# Patient Record
Sex: Male | Born: 1985 | Race: Black or African American | Hispanic: No | Marital: Single | Smoking: Former smoker
Health system: Southern US, Community
[De-identification: ages and names within clinical notes are randomized; demographics above are authoritative.]

## PROBLEM LIST (undated history)

## (undated) DIAGNOSIS — E119 Type 2 diabetes mellitus without complications: Secondary | ICD-10-CM

## (undated) DIAGNOSIS — J40 Bronchitis, not specified as acute or chronic: Secondary | ICD-10-CM

## (undated) DIAGNOSIS — E7800 Pure hypercholesterolemia, unspecified: Secondary | ICD-10-CM

---
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
## 2003-03-17 ENCOUNTER — Emergency Department (HOSPITAL_COMMUNITY): Admission: EM | Admit: 2003-03-17 | Discharge: 2003-03-18 | Payer: Self-pay | Admitting: Emergency Medicine

## 2003-03-20 ENCOUNTER — Emergency Department (HOSPITAL_COMMUNITY): Admission: EM | Admit: 2003-03-20 | Discharge: 2003-03-20 | Payer: Self-pay | Admitting: Emergency Medicine

## 2003-04-21 ENCOUNTER — Emergency Department (HOSPITAL_COMMUNITY): Admission: EM | Admit: 2003-04-21 | Discharge: 2003-04-21 | Payer: Self-pay | Admitting: Emergency Medicine

## 2003-04-21 ENCOUNTER — Encounter: Payer: Self-pay | Admitting: Emergency Medicine

## 2003-05-10 ENCOUNTER — Emergency Department (HOSPITAL_COMMUNITY): Admission: EM | Admit: 2003-05-10 | Discharge: 2003-05-10 | Payer: Self-pay | Admitting: Emergency Medicine

## 2005-06-11 ENCOUNTER — Emergency Department (HOSPITAL_COMMUNITY): Admission: EM | Admit: 2005-06-11 | Discharge: 2005-06-11 | Payer: Self-pay | Admitting: Emergency Medicine

## 2005-11-14 ENCOUNTER — Emergency Department (HOSPITAL_COMMUNITY): Admission: EM | Admit: 2005-11-14 | Discharge: 2005-11-14 | Payer: Self-pay | Admitting: Emergency Medicine

## 2006-02-19 ENCOUNTER — Emergency Department (HOSPITAL_COMMUNITY): Admission: EM | Admit: 2006-02-19 | Discharge: 2006-02-19 | Payer: Self-pay | Admitting: Emergency Medicine

## 2010-11-03 ENCOUNTER — Emergency Department (HOSPITAL_COMMUNITY)
Admission: EM | Admit: 2010-11-03 | Discharge: 2010-11-03 | Disposition: A | Discharge status: Home / Self Care | Payer: Self-pay | Attending: Emergency Medicine | Admitting: Emergency Medicine

## 2010-11-03 DIAGNOSIS — R599 Enlarged lymph nodes, unspecified: Secondary | ICD-10-CM | POA: Insufficient documentation

## 2010-11-03 DIAGNOSIS — M542 Cervicalgia: Secondary | ICD-10-CM | POA: Insufficient documentation

## 2010-11-03 DIAGNOSIS — F17200 Nicotine dependence, unspecified, uncomplicated: Secondary | ICD-10-CM | POA: Insufficient documentation

## 2010-11-03 DIAGNOSIS — R5381 Other malaise: Secondary | ICD-10-CM | POA: Insufficient documentation

## 2010-11-03 DIAGNOSIS — J3489 Other specified disorders of nose and nasal sinuses: Secondary | ICD-10-CM | POA: Insufficient documentation

## 2010-11-03 DIAGNOSIS — J029 Acute pharyngitis, unspecified: Secondary | ICD-10-CM | POA: Insufficient documentation

## 2010-11-03 DIAGNOSIS — R5383 Other fatigue: Secondary | ICD-10-CM | POA: Insufficient documentation

## 2010-11-03 DIAGNOSIS — H9209 Otalgia, unspecified ear: Secondary | ICD-10-CM | POA: Insufficient documentation

## 2010-11-03 DIAGNOSIS — R071 Chest pain on breathing: Secondary | ICD-10-CM | POA: Insufficient documentation

## 2010-11-03 LAB — RAPID STREP SCREEN (MED CTR MEBANE ONLY): Streptococcus, Group A Screen (Direct): NEGATIVE

## 2011-06-24 ENCOUNTER — Emergency Department (HOSPITAL_COMMUNITY)
Admission: EM | Admit: 2011-06-24 | Discharge: 2011-06-24 | Disposition: A | Discharge status: Home / Self Care | Payer: Self-pay | Attending: Emergency Medicine | Admitting: Emergency Medicine

## 2011-06-24 DIAGNOSIS — F17200 Nicotine dependence, unspecified, uncomplicated: Secondary | ICD-10-CM | POA: Insufficient documentation

## 2011-06-24 DIAGNOSIS — X58XXXA Exposure to other specified factors, initial encounter: Secondary | ICD-10-CM | POA: Insufficient documentation

## 2011-06-24 DIAGNOSIS — S335XXA Sprain of ligaments of lumbar spine, initial encounter: Secondary | ICD-10-CM | POA: Insufficient documentation

## 2011-06-24 DIAGNOSIS — E669 Obesity, unspecified: Secondary | ICD-10-CM | POA: Insufficient documentation

## 2011-06-24 DIAGNOSIS — M549 Dorsalgia, unspecified: Secondary | ICD-10-CM | POA: Insufficient documentation

## 2011-06-24 LAB — URINALYSIS, ROUTINE W REFLEX MICROSCOPIC
Bilirubin Urine: NEGATIVE
Glucose, UA: NEGATIVE mg/dL
Hgb urine dipstick: NEGATIVE
Ketones, ur: NEGATIVE mg/dL
Leukocytes, UA: NEGATIVE
Nitrite: NEGATIVE
Protein, ur: NEGATIVE mg/dL
Specific Gravity, Urine: 1.018 (ref 1.005–1.030)
Urobilinogen, UA: 0.2 mg/dL (ref 0.0–1.0)
pH: 6 (ref 5.0–8.0)

## 2011-09-22 ENCOUNTER — Emergency Department (INDEPENDENT_AMBULATORY_CARE_PROVIDER_SITE_OTHER)
Admission: EM | Admit: 2011-09-22 | Discharge: 2011-09-22 | Disposition: A | Discharge status: Home / Self Care | Payer: Self-pay | Source: Home / Self Care | Attending: Emergency Medicine | Admitting: Emergency Medicine

## 2011-09-22 DIAGNOSIS — M109 Gout, unspecified: Secondary | ICD-10-CM

## 2011-09-22 HISTORY — DX: Bronchitis, not specified as acute or chronic: J40

## 2011-09-22 LAB — URIC ACID: Uric Acid, Serum: 10.8 mg/dL — ABNORMAL HIGH (ref 4.0–7.8)

## 2011-09-22 MED ORDER — INDOMETHACIN 50 MG PO CAPS: 50.0000 mg | ORAL_CAPSULE | Freq: Three times a day (TID) | ORAL | Status: DC

## 2011-09-22 MED ORDER — KETOROLAC TROMETHAMINE 60 MG/2ML IM SOLN
INTRAMUSCULAR | Status: AC
  Filled 2011-09-22: qty 2

## 2011-09-22 MED ORDER — KETOROLAC TROMETHAMINE 60 MG/2ML IM SOLN
60.0000 mg | Freq: Once | INTRAMUSCULAR | Status: AC
  Administered 2011-09-22: 60 mg via INTRAMUSCULAR

## 2011-09-22 NOTE — ED Notes (Signed)
Pt c/o lt great toe redness, pain and swelling that started last night while he was bowling.  Denies known injury.  States redness and pain is extending back into his foot and along base of toes.

## 2011-09-22 NOTE — ED Provider Notes (Signed)
Chief Complaint  Patient presents with  . Foot Pain    History of Present Illness:  The patient has had a two-day history of swelling, pain, redness, and heat of the MTP joint of the left right toe. He denies any trauma to the area. He's had no fever or chills. No other joint pains. He's had no similar episodes in the past and has not had a history of gout. His father has gout. He has recently eaten sea food and drank alcohol.  Review of Systems:  Other than noted above, the patient denies any of the following symptoms: Systemic:  No fevers, chills, sweats, or aches.  No fatigue or tiredness. Musculoskeletal:  No joint pain, arthritis, bursitis, swelling, back pain, or neck pain. Neurological:  No muscular weakness, paresthesias, headache, or trouble with speech or coordination.  No dizziness.   PMFSH:  Past medical history, family history, social history, meds, and allergies were reviewed.  Physical Exam:   Vital signs:  BP 144/68  Pulse 105  Temp(Src) 98.2 F (36.8 C) (Oral)  Resp 16  SpO2 100% Gen:  Alert and oriented times 3.  In no distress. Musculoskeletal: Exam of the foot reveals marked swelling, erythema, heat, and tenderness to palpation centered over the MTP joint of the left great toe. This extends up onto the dorsum of the foot and down the toe. Otherwise, all joints had a full a ROM with no swelling, bruising or deformity.  No edema, pulses full. Extremities were warm and pink.  Capillary refill was brisk.  Skin:  Clear, warm and dry.  No rash. Neuro:  Alert and oriented times 3.  Muscle strength was normal.  Sensation was intact to light touch.   Labs:   Results for orders placed during the hospital encounter of 09/22/11  URIC ACID      Component Value Range   Uric Acid, Serum 10.8 (*) 4.0 - 7.8 (mg/dL)     Radiology:  No results found.  Medications given in [facility]:  He was given Toradol 60 mg IM.  Assessment:   Diagnoses that have been ruled out:  Diagnoses that  are still under consideration:  Final diagnoses:  Gout    Plan:   1.  The following meds were prescribed:   New Prescriptions   INDOMETHACIN (INDOCIN) 50 MG CAPSULE    Take 1 capsule (50 mg total) by mouth 3 (three) times daily with meals.   2.  The patient was instructed in symptomatic care, including rest and activity, elevation, application of ice and compression.  Appropriate handouts were given. 3.  The patient was told to return if becoming worse in any way, if no better in 3 or 4 days, and given some red flag symptoms that would indicate earlier return.   4.  The patient was told to follow up in one to 2 weeks with a primary care physician.   Roque Lias, MD 09/22/11 [phone]

## 2011-09-25 ENCOUNTER — Telehealth (HOSPITAL_COMMUNITY): Payer: Self-pay | Admitting: *Deleted

## 2011-09-25 NOTE — ED Notes (Signed)
Order obtained from Dr. Lorenz Coaster for Tramadol 50 mg. #20 1-2 every 8 hrs prn pain.  Pt. wants Rx. called to [facility] on [address].  Rx. called to [facility] @ [phone]. Jimmy Jordan 09/25/2011

## 2011-10-15 ENCOUNTER — Telehealth (HOSPITAL_COMMUNITY): Payer: Self-pay | Admitting: *Deleted

## 2011-10-15 NOTE — ED Notes (Signed)
Discussed with Dr. Juanetta Gosling about pt. needing refill of Indocin for gout. Pt. has a pending appt. in 2 weeks with PCP.  He approved refill. I called pt.'s girlfriend back and told her Dr. approved a refill and she said to call it to [facility] on [address]. Rx. called to pharamcist @ [phone].

## 2011-11-04 ENCOUNTER — Emergency Department (HOSPITAL_COMMUNITY)
Admission: EM | Admit: 2011-11-04 | Discharge: 2011-11-04 | Disposition: A | Discharge status: Home Health | Payer: Self-pay | Attending: Emergency Medicine | Admitting: Emergency Medicine

## 2011-11-04 ENCOUNTER — Encounter (HOSPITAL_COMMUNITY): Payer: Self-pay | Admitting: Adult Health

## 2011-11-04 DIAGNOSIS — M79609 Pain in unspecified limb: Secondary | ICD-10-CM | POA: Insufficient documentation

## 2011-11-04 DIAGNOSIS — M109 Gout, unspecified: Secondary | ICD-10-CM | POA: Insufficient documentation

## 2011-11-04 MED ORDER — TRAMADOL HCL 50 MG PO TABS: 50.0000 mg | ORAL_TABLET | Freq: Four times a day (QID) | ORAL | Status: AC | PRN

## 2011-11-04 MED ORDER — INDOMETHACIN 25 MG PO CAPS: 25.0000 mg | ORAL_CAPSULE | Freq: Three times a day (TID) | ORAL | Status: AC | PRN

## 2011-11-04 NOTE — ED Notes (Signed)
Reports 2 months of left great toe pain that feels like gout. Toe is painful to touch. No redness noted

## 2011-11-04 NOTE — ED Provider Notes (Signed)
History     CSN: [id number]  Arrival date & time 11/04/11  1632   First MD Initiated Contact with Patient 11/04/11 1803      Chief Complaint  Patient presents with  . Toe Pain    (Consider location/radiation/quality/duration/timing/severity/associated sxs/prior treatment) HPI  26 year old male with history of gout, is presenting to the ED with chief complaints of left great toe pain. Patient states he has been experiencing pain to his left great toe for the past 2 months. Pain is described as a throbbing, pressure sensation worsening with palpation and walking. He has been seen and evaluated at the urgent care clinic 2 months ago and has been taking indomethacin and ultram that were prescribed.  Pt has ran out of his meds for the past few days.  Sts pain worsen without medication.  His dad has gout and recommend him to come to ER to have his toe "drained".  Pt also sts he has been avoiding red meat and shellfish, however does admits to drinking alcohol on a regular basis.  Pt denies fever, chills, rash, ankle or knee pain.  Denies any recent trauma.    Past Medical History  Diagnosis Date  . Bronchitis     No past surgical history on file.  No family history on file.  History  Substance Use Topics  . Smoking status: Current Everyday Smoker -- 0.5 packs/day    Types: Cigarettes  . Smokeless tobacco: Not on file  . Alcohol Use: Yes     occasional      Review of Systems  All other systems reviewed and are negative.    Allergies  Penicillins  Home Medications   Current Outpatient Rx  Name Route Sig Dispense Refill  . INDOMETHACIN 50 MG PO CAPS Oral Take 1 capsule (50 mg total) by mouth 3 (three) times daily with meals. 30 capsule 0    There were no vitals taken for this visit.  Physical Exam  Constitutional: He appears well-developed and well-nourished. No distress.  HENT:  Head: Atraumatic.  Eyes: Conjunctivae are normal.  Neck: Neck supple.    Musculoskeletal: Normal range of motion.       Tenderness noted to left great toe without obvious overlying skin changes, swelling, or rash. Normal range of motion of great toe.  Normal left ankle examination. No evidence of difficulty. Sensation is intact throughout. Brisk capillary refills to all toes.  Neurological: He is alert.  Skin: Skin is warm.    ED Course  Procedures (including critical care time)  Labs Reviewed - No data to display No results found.   No diagnosis found.    MDM  Left great toe pain, likely secondary to gout. Patient does have a family history of gout, and history of alcohol on a regular basis, which may contribute to his gouty flare. I recommend alcohol cessation and avoidance of certain types of food. I will also prescribe Ultram and indomethacin. Hold give her referral to a foot specialist in the primary care Dr. for further management. I do not believe that this is a septic joint, secondary to infection.        Fayrene Helper, PA-C 11/04/11 1820

## 2011-11-04 NOTE — Discharge Instructions (Signed)
Gout Gout is an inflammatory condition (arthritis) caused by a buildup of uric acid crystals in the joints. Uric acid is a chemical that is normally present in the blood. Under some circumstances, uric acid can form into crystals in your joints. This causes joint redness, soreness, and swelling (inflammation). Repeat attacks are common. Over time, uric acid crystals can form into masses (tophi) near a joint, causing disfigurement. Gout is treatable and often preventable. CAUSES  The disease begins with elevated levels of uric acid in the blood. Uric acid is produced by your body when it breaks down a naturally found substance called purines. This also happens when you eat certain foods such as meats and fish. Causes of an elevated uric acid level include:  Being passed down from parent to child (heredity).   Diseases that cause increased uric acid production (obesity, psoriasis, some cancers).   Excessive alcohol use.   Diet, especially diets rich in meat and seafood.   Medicines, including certain cancer-fighting drugs (chemotherapy), diuretics, and aspirin.   Chronic kidney disease. The kidneys are no longer able to remove uric acid well.   Problems with metabolism.  Conditions strongly associated with gout include:  Obesity.   High blood pressure.   High cholesterol.   Diabetes.  Not everyone with elevated uric acid levels gets gout. It is not understood why some people get gout and others do not. Surgery, joint injury, and eating too much of certain foods are some of the factors that can lead to gout. SYMPTOMS   An attack of gout comes on quickly. It causes intense pain with redness, swelling, and warmth in a joint.   Fever can occur.   Often, only one joint is involved. Certain joints are more commonly involved:   Base of the big toe.   Knee.   Ankle.   Wrist.   Finger.  Without treatment, an attack usually goes away in a few days to weeks. Between attacks, you  usually will not have symptoms, which is different from many other forms of arthritis. DIAGNOSIS  Your caregiver will suspect gout based on your symptoms and exam. Removal of fluid from the joint (arthrocentesis) is done to check for uric acid crystals. Your caregiver will give you a medicine that numbs the area (local anesthetic) and use a needle to remove joint fluid for exam. Gout is confirmed when uric acid crystals are seen in joint fluid, using a special microscope. Sometimes, blood, urine, and X-ray tests are also used. TREATMENT  There are 2 phases to gout treatment: treating the sudden onset (acute) attack and preventing attacks (prophylaxis). Treatment of an Acute Attack  Medicines are used. These include anti-inflammatory medicines or steroid medicines.   An injection of steroid medicine into the affected joint is sometimes necessary.   The painful joint is rested. Movement can worsen the arthritis.   You may use warm or cold treatments on painful joints, depending which works best for you.   Discuss the use of coffee, vitamin C, or cherries with your caregiver. These may be helpful treatment options.  Treatment to Prevent Attacks After the acute attack subsides, your caregiver may advise prophylactic medicine. These medicines either help your kidneys eliminate uric acid from your body or decrease your uric acid production. You may need to stay on these medicines for a very long time. The early phase of treatment with prophylactic medicine can be associated with an increase in acute gout attacks. For this reason, during the first few months   of treatment, your caregiver may also advise you to take medicines usually used for acute gout treatment. Be sure you understand your caregiver's directions. You should also discuss dietary treatment with your caregiver. Certain foods such as meats and fish can increase uric acid levels. Other foods such as dairy can decrease levels. Your caregiver  can give you a list of foods to avoid. HOME CARE INSTRUCTIONS   Do not take aspirin to relieve pain. This raises uric acid levels.   Only take over-the-counter or prescription medicines for pain, discomfort, or fever as directed by your caregiver.   Rest the joint as much as possible. When in bed, keep sheets and blankets off painful areas.   Keep the affected joint raised (elevated).   Use crutches if the painful joint is in your leg.   Drink enough water and fluids to keep your urine clear or pale yellow. This helps your body get rid of uric acid. Do not drink alcoholic beverages. They slow the passage of uric acid.   Follow your caregiver's dietary instructions. Pay careful attention to the amount of protein you eat. Your daily diet should emphasize fruits, vegetables, whole grains, and fat-free or low-fat milk products.   Maintain a healthy body weight.  SEEK MEDICAL CARE IF:   You have an oral temperature above 102 F (38.9 C).   You develop diarrhea, vomiting, or any side effects from medicines.   You do not feel better in 24 hours, or you are getting worse.  SEEK IMMEDIATE MEDICAL CARE IF:   Your joint becomes suddenly more tender and you have:   Chills.   An oral temperature above 102 F (38.9 C), not controlled by medicine.  MAKE SURE YOU:   Understand these instructions.   Will watch your condition.   Will get help right away if you are not doing well or get worse.  Document Released: 08/29/2000 Document Revised: 05/14/2011 Document Reviewed: 12/10/2009 [employer] Patient Information 2012 [employer]. 

## 2011-11-08 NOTE — ED Provider Notes (Signed)
Medical screening examination/treatment/procedure(s) were performed by non-physician practitioner and as supervising physician I was immediately available for consultation/collaboration.   Yoselin Amerman A. Lettie Czarnecki, MD 11/08/11 0710 

## 2013-12-04 ENCOUNTER — Encounter (HOSPITAL_COMMUNITY): Payer: Self-pay | Admitting: Emergency Medicine

## 2013-12-04 ENCOUNTER — Emergency Department (HOSPITAL_COMMUNITY): Payer: Self-pay

## 2013-12-04 ENCOUNTER — Emergency Department (HOSPITAL_COMMUNITY)
Admission: EM | Admit: 2013-12-04 | Discharge: 2013-12-04 | Disposition: A | Discharge status: Home / Self Care | Payer: Self-pay | Attending: Emergency Medicine | Admitting: Emergency Medicine

## 2013-12-04 DIAGNOSIS — F17200 Nicotine dependence, unspecified, uncomplicated: Secondary | ICD-10-CM | POA: Insufficient documentation

## 2013-12-04 DIAGNOSIS — Z79899 Other long term (current) drug therapy: Secondary | ICD-10-CM | POA: Insufficient documentation

## 2013-12-04 DIAGNOSIS — Z880 Allergy status to penicillin: Secondary | ICD-10-CM | POA: Insufficient documentation

## 2013-12-04 DIAGNOSIS — M25476 Effusion, unspecified foot: Secondary | ICD-10-CM | POA: Insufficient documentation

## 2013-12-04 DIAGNOSIS — M25473 Effusion, unspecified ankle: Secondary | ICD-10-CM | POA: Insufficient documentation

## 2013-12-04 DIAGNOSIS — M109 Gout, unspecified: Secondary | ICD-10-CM | POA: Insufficient documentation

## 2013-12-04 MED ORDER — TRAMADOL HCL 50 MG PO TABS: 50.0000 mg | ORAL_TABLET | Freq: Four times a day (QID) | ORAL | Status: DC | PRN

## 2013-12-04 MED ORDER — INDOMETHACIN 25 MG PO CAPS: 25.0000 mg | ORAL_CAPSULE | Freq: Three times a day (TID) | ORAL | Status: DC | PRN

## 2013-12-04 NOTE — ED Notes (Signed)
Pt reports pain and stiffness in 1 st toe r/foot. Pt reports sharp pain in joint with movement x 1 day. Hx of gout

## 2013-12-04 NOTE — Discharge Instructions (Signed)
Please call and set-up an appointment with [facility] to be assessed  Please call and set-up an appointment with [facility] Please rest and stay hydrated Please take medications as prescribed - while on pain medications is to be no drinking alcohol, driving, operating any heavy machinery if there is extra please dispose in a proper manner. Please keep foot elevated and rest Please continue to monitor symptoms closely and if symptoms are to worsen or change (fever greater than 101, chills, chest pain, shortness of breath, difficulty breathing, numbness, tingling, red streaks running up the foot, worsening swelling, drainage, fall, injury) please report back to the ED immediately   Gout Gout is when your joints become red, sore, and swell (inflammed). This is caused by the buildup of uric acid crystals in the joints. Uric acid is a chemical that is normally in the blood. If the level of uric acid gets too high in the blood, these crystals form in your joints and tissues. Over time, these crystals can form into masses near the joints and tissues. These masses can destroy bone and cause the bone to look misshapen (deformed). HOME CARE   Do not take aspirin for pain.  Only take medicine as told by your doctor.  Rest the joint as much as you can. When in bed, keep sheets and blankets off painful areas.  Keep the sore joints raised (elevated).  Put warm or cold packs on painful joints. Use of warm or cold packs depends on which works best for you.  Use crutches if the painful joint is in your leg.  Drink enough fluids to keep your pee (urine) clear or pale yellow. Limit alcohol, sugary drinks, and drinks with fructose in them.  Follow your diet instructions. Pay careful attention to how much protein you eat. Include fruits, vegetables, whole grains, and fat-free or low-fat milk products in your daily diet. Talk to your doctor or dietician about the use of coffee, vitamin C, and  cherries. These may help lower uric acid levels.  Keep a healthy body weight. GET HELP RIGHT AWAY IF:   You have watery poop (diarrhea), throw up (vomit), or have any side effects from medicines.  You do not feel better in 24 hours, or you are getting worse.  Your joint becomes suddenly more tender, and you have chills or a fever. MAKE SURE YOU:   Understand these instructions.  Will watch your condition.  Will get help right away if you are not doing well or get worse. Document Released: 06/10/2008 Document Revised: 12/27/2012 Document Reviewed: 12/10/2009 [facility] Patient Information 2014 [place].  Purine Restricted Diet A low-purine diet consists of foods that reduce uric acid made in your body. INDICATIONS FOR USE  Your caregiver may ask you to follow a low-purine diet to reduce gout flairs.  GUIDELINES  Avoid high-purine foods, including all alcohol, yeast extracts taken as supplements, and sauces made from meats (like gravy). Do not eat high-purine meats, including anchovies, sardines, herring, mussels, tuna, codfish, scallops, trout, haddock, bacon, organ meats, tripe, goose, wild game, and sweetbreads.  Grains  Allowed/Recommended: All, except those listed to consume in moderation.  Consume in Moderation: Oatmeal ( cup uncooked daily), wheat bran or germ ( cup daily), and whole grains. Vegetables  Allowed/Recommended: All, except those listed to consume in moderation.  Consume in Moderation: Asparagus, cauliflower, spinach, mushrooms, and green peas ( cup daily). Fruit  Allowed/Recommended: All.  Consume in Moderation: None. Meat and Meat Substitutes  Allowed/Recommended:  Eggs, nuts, and peanut butter.  Consume in Moderation: Limit to 4 to 6 oz daily. Avoid high-purine meats. Lentils, peas, and dried beans (1 cup daily). Milk  Allowed/Recommended: All. Choose low-fat or skim when possible.  Consume in Moderation: None. Fats and  Oils  Allowed/Recommended: All.  Consume in Moderation: None. Beverages  Allowed/Recommended: All, except those listed to avoid.  Avoid: All alcohol. Condiments/Miscellaneous  Allowed/Recommended: All, except those listed to consume in moderation.  Consume in Moderation: Bouillon and meat-based broths and soups. Document Released: 12/27/2010 Document Revised: 11/24/2011 Document Reviewed: 12/27/2010 [facility] Patient Information 2014 [place], [place].   Emergency Department Resource Guide 1) Find a Doctor and Pay Out of Pocket Although you won't have to find out who is covered by your insurance plan, it is a good idea to ask around and get recommendations. You will then need to call the office and see if the doctor you have chosen will accept you as a new patient and what types of options they offer for patients who are self-pay. Some doctors offer discounts or will set up payment plans for their patients who do not have insurance, but you will need to ask so you aren't surprised when you get to your appointment.  2) Contact Your Local Health Department Not all health departments have doctors that can see patients for sick visits, but many do, so it is worth a call to see if yours does. If you don't know where your local health department is, you can check in your phone book. The CDC also has a tool to help you locate your state's health department, and many state websites also have listings of all of their local health departments.  3) Find a Walk-in Clinic If your illness is not likely to be very severe or complicated, you may want to try a walk in clinic. These are popping up all over the country in pharmacies, drugstores, and shopping centers. They're usually staffed by nurse practitioners or physician assistants that have been trained to treat common illnesses and complaints. They're usually fairly quick and inexpensive. However, if you have serious medical issues or chronic medical  problems, these are probably not your best option.  No Primary Care Doctor: - Call Health Connect at  [phone] - they can help you locate a primary care doctor that  accepts your insurance, provides certain services, etc. - Physician Referral Service- [phone]  Chronic Pain Problems: Organization         Address  Phone   Notes  [facility]  [phone] Patients need to be referred by their primary care doctor.   Medication Assistance: Organization         Address  Phone   Notes  [facility] Medication [facility] [address] [place], [place] [address] [phone] --Must be a resident of [facility] -- Must have NO insurance coverage whatsoever (no Medicaid/ Medicare, etc.) -- The pt. MUST have a primary care doctor that directs their care regularly and follows them in the community   [facility]  [phone]   [employer]  [phone]    Agencies that provide inexpensive medical care: Organization         Address  Phone   Notes  [facility]  [phone]   [facility]    [phone]   [facility] [address] [place], [place] [address] [phone]   [facility] [address], [place] [phone]   Planned Parenthood    [phone]   [facility]    [phone]   [facility]  [address], [place] Phone:  [phone]) [phone], Fax:  [phone] Hours of Operation:  9 am - 6 pm, M-F.  Also accepts Medicaid/Medicare and self-pay.  [facility]  [address], [place] Phone: [phone](336) [phone], Fax: [phone]. Hours of Operation:  8:30 am - 5:30 pm, M-F.  Also accepts Medicaid and self-pay.  [facility] [place] [address], [place] Phone: [phone]) [phone]   [facility] [address], [place], [place] [phone], Ext. [phone]  Mondays & Thursdays: 7-9 AM.  First 15 patients are seen on a first come, first serve basis.    Medicaid-accepting [facility] Providers:  Organization         Address  Phone   Notes  [facility] [address], [place] [phone]) [phone] Also accepts self-pay patients.  [facility] [address] [place], [place]  [phone]) [phone]   [facility] [address], [place] [phone]) [phone]   [facility] [address] [place] [address], [place] [phone]   Renaye RakersVeita Bland [address], [place]   [phone] Only accepts [place] Access [place] patients after they have their name applied to their card.   Self-Pay (no insurance) in [facility]:  Organization         Address  Phone   Notes  Sickle Cell Patients, [facility] [address], [place] [phone]   [facility] [address], [place] [phone]) [phone]   [facility]  [address], [place] [phone]) [phone]   [facility]/Dr. Osei-Bonsu  [address] [place] [address], [place] or [phone], [place] [phone] Phone number for both [place] and [place] locations is the same.  [facility] [address], [place] [phone]) [phone]   [facility] [address] [place] [address], [place] or [address] [phone](336) [phone]) [phone]   [facility] [address], [place] [phone]) [phone], phone; [phone](336) [phone], fax Sees patients 1st and 3rd Saturday of every month.  Must not qualify for public or private insurance (i.e. Medicaid, Medicare, [place] Health Choice, Veterans' Benefits)  Household income should be no more than 200% of the poverty level The clinic cannot treat you if you are pregnant or think you are pregnant  Sexually transmitted diseases are not treated at  the clinic.    Dental Care: Organization         Address  Phone  Notes  [facility] of [facility] [address], [place] [phone] Accepts children up to age 30 who are enrolled in [place] or [place] Health Choice; pregnant women with a Medicaid card; and children who have applied for Medicaid or [place] Health Choice, but were declined, whose parents can pay a reduced fee at time of service.  [facility] of [facility] [place]  [address], [place] [phone] Accepts children up to age 221 who are enrolled in [place] or [place] Health Choice; pregnant women with a Medicaid card; and children who have  applied for Medicaid or [place] Health Choice, but were declined, whose parents can pay a reduced fee at time of service.  [facility]  [address] [place], [place] [phone] Patients are seen by appointment only. Walk-ins are not accepted. [facility] will see patients 25 years of age and older. Monday - Tuesday (8am-5pm) Most Wednesdays (8:30-5pm) $30 per visit, cash only  [facility] Adult [facility] Access [facility]  [address], [facility] [phone] Patients are seen by appointment only. Walk-ins are not accepted. [facility] will see patients 24 years of age and older. One Wednesday Evening (Monthly: Volunteer Based).  $30 per visit, cash only  [employer]  [phone] for adults; Children under age 53, call Graduate Pediatric Dentistry at [phone]. Children aged 42-14, please call [phone] to request a pediatric application.  Dental services are provided in all areas of dental care including fillings, crowns and bridges, complete and partial dentures, implants, gum treatment, root canals, and extractions. Preventive care is also provided. Treatment is provided to both adults and children. Patients are selected via a lottery and there is often a  waiting list.   [facility] [address] [employer]  [phone] [email or web address]   [facility] [address] [place], [place] [phone], Ext. [phone] Second and Fourth Thursday of each month, opens at 6:30 AM; Clinic ends at 9 AM.  Patients are seen on a first-come first-served basis, and a limited number are seen during each clinic.   [facility]  [address] [place], [place] [phone]   Eligibility Requirements You must have lived in [place], [place], or [place] counties for at least the last three months.   You cannot be eligible for state or federal sponsored [place], including [employer], [place], or [employer].   You generally cannot be eligible for healthcare insurance through your employer.    How to apply: Eligibility screenings are held every Tuesday and Wednesday afternoon from 1:00 pm until 4:00 pm. You do not need an appointment for the interview!  [facility] [address], [place], [place] [phone]   [facility] Health Department  [phone]   [facility] Health Department  [phone]   [facility] Health Department  [phone]    Behavioral Health Resources in the Community: Intensive Outpatient Programs Organization         Address  Phone  Notes  [facility] [address], [place], [place] [phone]   [facility] Outpatient [address] [place], [place] [phone]   [facility] [address], [place], [place]  [phone]   [facility] [address],  [place], [place] [phone] or [phone]   Substance Abuse Resources Organization         Address  Phone  Notes  [facility] Drug Services  [phone]   [facility]  [phone]   [facility] [place]  [phone]   Floydene Flock  [phone]   Residential & Outpatient Substance Abuse  Program  [phone]   Psychological Services Organization         Address  Phone  Notes  [facility] Behavioral Health  [phone]   [facility] Services  [phone]   [facility] [address] [place] [phone] or [phone]    Mobile Crisis Teams Organization  Address  Phone  Notes  [facility], Mobile Crisis Care Unit  [phone]   [facility]  [address] [place], [place]   [facility] [address] [place] [phone]    Self-Help/Support Groups Organization         Address  Phone             Notes  [place] of [place] - variety of support groups  [facility] Call for more information  Narcotics Anonymous (NA), [facility] [address], [employer] [place]  2 meetings at this location   Special educational needs teacher         Address  Phone  Notes  [facility] [place],    [place]  [phone]   [facility]  [address] [place] 034742, [place], [place]   [place], [place] [phone] Admissions: 8am-3pm M-F  Incentives Substance [place] [address],    [place], [place] [phone]   [facility] [address] [place], [place], [place]   [facility] [address]  [place], [place]   [facility] - Intensive Outpatient [address], [place], [place]   [facility] [place].,  [place], [place] [phone] or [phone]   [facility] (RTS) [address] [place], [place] Accepts Medicaid  [facility] [place] [address]  [place] [phone] Substance Abuse/Addiction Treatment   [facility] Organization          Address  Phone  Notes  [facility]  [phone]   Domenic Schwab, PhD [address] [place], [place]   [phone] or [phone]   [place], [place] [phone]   [facility] [address] [place], [place] [phone] Insurance/Medicaid/sponsorship through [facility] and Families [address], [address] [place]                                    [place], [place] [phone] [address], [place] [phone]    Dr. Adele Schilder  [phone]   [facility] [place] Dept. 1) [address] [place] 2) [place] 3)  [place] [address], [place] [phone]  [phone]   [place] [phone] or [phone] (After Hours)

## 2013-12-04 NOTE — ED Provider Notes (Signed)
CSN: [id number]     Arrival date & time 12/04/13  1126 History  This chart was scribed for non-physician practitioner, Raymon MuttonMarissa Kimmi Acocella, PA-C,working with Laray AngerKathleen M McManus, DO, by Karle PlumberJennifer Tensley, ED Scribe.  This patient was seen in room WTR7/WTR7 and the patient's care was started at 12:21 PM.  Chief Complaint  Patient presents with  . Toe Pain    r/foot 1st toe    The history is provided by the patient. No language interpreter was used.   HPI Comments:  Jimmy Jordan is a 28 y.o. male with h/o gout, who presents to the Emergency Department complaining of worsening left great toe sharp pain and stiffness that started approximately 18 hours ago. He states he has had similar symptoms of the same area approximately three years ago. He reports associated swelling and left foot pain. He states movement makes the pain worse. He denies taking anything for the pain. He denies fever, chills, CP, SOB, difficulty breathing, numbness or tingling of the toe, drainage from the toe. He states he is allergic to PCN.   Past Medical History  Diagnosis Date  . Bronchitis   . Gout    History reviewed. No pertinent past surgical history. Family History  Problem Relation Age of Onset  . Hypertension Mother   . Diabetes Father    History  Substance Use Topics  . Smoking status: Current Every Day Smoker -- 0.50 packs/day    Types: Cigarettes  . Smokeless tobacco: Not on file  . Alcohol Use: Yes     Comment: occasional    Review of Systems  Constitutional: Negative for fever.  Musculoskeletal: Positive for arthralgias (left great toe) and joint swelling (great left toe).  All other systems reviewed and are negative.   Allergies  Penicillins  Home Medications   Current Outpatient Rx  Name  Route  Sig  Dispense  Refill  . indomethacin (INDOCIN) 25 MG capsule   Oral   Take 1 capsule (25 mg total) by mouth 3 (three) times daily as needed.   30 capsule   0   . traMADol (ULTRAM) 50 MG  tablet   Oral   Take 1 tablet (50 mg total) by mouth every 6 (six) hours as needed.   15 tablet   0    Triage Vitals: BP 138/89  Pulse 71  Temp(Src) 98.2 F (36.8 C) (Oral)  Wt 280 lb (127.007 kg)  SpO2 99% Physical Exam  Nursing note and vitals reviewed. Constitutional: He is oriented to person, place, and time. He appears well-developed and well-nourished.  HENT:  Head: Normocephalic and atraumatic.  Mouth/Throat: Oropharynx is clear and moist. No oropharyngeal exudate.  Eyes: Conjunctivae and EOM are normal. Pupils are equal, round, and reactive to light. Left eye exhibits no discharge.  Neck: Normal range of motion. Neck supple. No tracheal deviation present.  Cardiovascular: Normal rate, regular rhythm and normal heart sounds.  Exam reveals no friction rub.   No murmur heard. Pulses:      Radial pulses are 2+ on the right side, and 2+ on the left side.       Dorsalis pedis pulses are 2+ on the right side, and 2+ on the left side.  Cap refill < 3 seconds   Pulmonary/Chest: Effort normal and breath sounds normal. No respiratory distress. He has no wheezes. He has no rales.  Musculoskeletal: Normal range of motion.       Feet:  Swelling, erythema, warmth upon palpation to the left great  toe. Full ROM noted to the left great toe. Negative deformities noted to the great left toe.  Discomfort upon palpation to the left great toe circumferentially with negative crepitus. Full ROM to upper and lower extremities without difficulty noted, negative ataxia noted.  Lymphadenopathy:    He has no cervical adenopathy.  Neurological: He is alert and oriented to person, place, and time. No cranial nerve deficit. He exhibits normal muscle tone. Coordination normal.  Cranial nerves III-XII grossly intact Strength 5+/5+ to upper and lower extremities bilaterally with resistance applied, equal distribution noted Strength intact to the digits of the left foot Sensation intact with  differentiation to sharp and dull touch   Skin: Skin is warm and dry.  Please see muscloskeletal  Psychiatric: He has a normal mood and affect. His behavior is normal.    ED Course  Procedures (including critical care time) DIAGNOSTIC STUDIES: Oxygen Saturation is 99% on RA, normal by my interpretation.   COORDINATION OF CARE: 12:26 PM- Will prescribe Indomethacin for pain. Will [facility]. Pt verbalizes understanding and agrees to plan.  This provider was made aware by radiology tech the patient refused plain films.  Medications - No data to display  Labs Review Labs Reviewed - No data to display Imaging Review No results found.   EKG Interpretation None      MDM   Final diagnoses:  Gout    Filed Vitals:   12/04/13 1146  BP: 138/89  Pulse: 71  Temp: 98.2 F (36.8 C)  TempSrc: Oral  Weight: 280 lb (127.007 kg)  SpO2: 99%     I personally performed the services described in this documentation, which was scribed in my presence. The recorded information has been reviewed and is accurate.  Patient presenting to the ED with a gout attack that occurred in started last evening. Reported that the discomfort is localized to his left great toe-reported swelling, warmth upon palpation, sharp pain with mild radiation to the dorsal aspect of his left foot. Stated that he had an episode in 2012 with similar episodes. Stated that the gout always occurs in his left great toe. Denied fever, chills, chest pain, shortness of breath, difficulty breathing, injury, tingling, numbness. Alert and oriented. GCS 15. Heart rate and rhythm normal. Lungs clear to auscultation to upper and lower lobes bilaterally. Radial pulses 2+ bilaterally and DP pulses 2+ bilaterally. Cap refill less than 3 seconds. Left great toe identified to be swollen, erythematous with warmth upon palpation. Negative active drainage noted. Negative deformities identified.  Discomfort upon palpation to the great toe circumferentially-negative crepitus upon palpation. Full range of motion noted to the left great toe. Strength intact. Sensation intact with differentiation to sharp and dull touch. Doubt septic joint. Doubt gangrene. Suspicion to be gout-patient has history of gout with similar presentations. Patient stable, afebrile. Discharged patient. Discharge patient with indomethacin and tramadol. Discussed with patient to rest and elevate. Discussed with patient to avoid purines in diet - discussed with patient proper diet. Discussed with patient to rest and stay hydrated-to drink plenty of water. Referred patient to [facility] as well as [facility]. Discussed with patient to closely monitor symptoms and if symptoms are to worsen or change to report back to the ED - strict return instructions given.  Patient agreed to plan of care, understood, all questions answered.   Raymon Mutton, PA-C 12/04/13 2129

## 2013-12-05 NOTE — ED Provider Notes (Signed)
Medical screening examination/treatment/procedure(s) were performed by non-physician practitioner and as supervising physician I was immediately available for consultation/collaboration.   EKG Interpretation None        Esabella Stockinger M Delance Weide, DO 12/05/13 2112 

## 2014-03-14 ENCOUNTER — Emergency Department (INDEPENDENT_AMBULATORY_CARE_PROVIDER_SITE_OTHER)
Admission: EM | Admit: 2014-03-14 | Discharge: 2014-03-14 | Disposition: A | Discharge status: Home / Self Care | Payer: Self-pay | Source: Home / Self Care | Attending: Family Medicine | Admitting: Family Medicine

## 2014-03-14 ENCOUNTER — Encounter (HOSPITAL_COMMUNITY): Payer: Self-pay | Admitting: Emergency Medicine

## 2014-03-14 DIAGNOSIS — J029 Acute pharyngitis, unspecified: Secondary | ICD-10-CM

## 2014-03-14 LAB — POCT RAPID STREP A: Streptococcus, Group A Screen (Direct): NEGATIVE

## 2014-03-14 LAB — POCT INFECTIOUS MONO SCREEN: Mono Screen: NEGATIVE

## 2014-03-14 MED ORDER — GUAIFENESIN-CODEINE 100-10 MG/5ML PO SOLN: 10.0000 mL | ORAL | Status: DC | PRN

## 2014-03-14 MED ORDER — ACETAMINOPHEN 325 MG PO TABS
ORAL_TABLET | ORAL | Status: AC
  Filled 2014-03-14: qty 2

## 2014-03-14 MED ORDER — PREDNISONE 10 MG PO TABS: ORAL_TABLET | ORAL | Status: DC

## 2014-03-14 MED ORDER — CLINDAMYCIN HCL 300 MG PO CAPS: 300.0000 mg | ORAL_CAPSULE | Freq: Three times a day (TID) | ORAL | Status: DC

## 2014-03-14 MED ORDER — ACETAMINOPHEN 325 MG PO TABS
650.0000 mg | ORAL_TABLET | Freq: Once | ORAL | Status: AC
  Administered 2014-03-14: 650 mg via ORAL

## 2014-03-14 NOTE — ED Provider Notes (Signed)
Medical screening examination/treatment/procedure(s) were performed by resident physician or non-physician practitioner and as supervising physician I was immediately available for consultation/collaboration.   KINDL,JAMES DOUGLAS MD.   James D Kindl, MD 03/14/14 1558 

## 2014-03-14 NOTE — Discharge Instructions (Signed)
Pharyngitis °Pharyngitis is redness, pain, and swelling (inflammation) of your pharynx.  °CAUSES  °Pharyngitis is usually caused by infection. Most of the time, these infections are from viruses (viral) and are part of a cold. However, sometimes pharyngitis is caused by bacteria (bacterial). Pharyngitis can also be caused by allergies. Viral pharyngitis may be spread from person to person by coughing, sneezing, and personal items or utensils (cups, forks, spoons, toothbrushes). Bacterial pharyngitis may be spread from person to person by more intimate contact, such as kissing.  °SIGNS AND SYMPTOMS  °Symptoms of pharyngitis include:   °· Sore throat.   °· Tiredness (fatigue).   °· Low-grade fever.   °· Headache. °· Joint pain and muscle aches. °· Skin rashes. °· Swollen lymph nodes. °· Plaque-like film on throat or tonsils (often seen with bacterial pharyngitis). °DIAGNOSIS  °Your health care provider will ask you questions about your illness and your symptoms. Your medical history, along with a physical exam, is often all that is needed to diagnose pharyngitis. Sometimes, a rapid strep test is done. Other lab tests may also be done, depending on the suspected cause.  °TREATMENT  °Viral pharyngitis will usually get better in 3-4 days without the use of medicine. Bacterial pharyngitis is treated with medicines that kill germs (antibiotics).  °HOME CARE INSTRUCTIONS  °· Drink enough water and fluids to keep your urine clear or pale yellow.   °· Only take over-the-counter or prescription medicines as directed by your health care provider:   °¨ If you are prescribed antibiotics, make sure you finish them even if you start to feel better.   °¨ Do not take aspirin.   °· Get lots of rest.   °· Gargle with 8 oz of salt water (½ tsp of salt per 1 qt of water) as often as every 1-2 hours to soothe your throat.   °· Throat lozenges (if you are not at risk for choking) or sprays may be used to soothe your throat. °SEEK MEDICAL  CARE IF:  °· You have large, tender lumps in your neck. °· You have a rash. °· You cough up green, yellow-brown, or bloody spit. °SEEK IMMEDIATE MEDICAL CARE IF:  °· Your neck becomes stiff. °· You drool or are unable to swallow liquids. °· You vomit or are unable to keep medicines or liquids down. °· You have severe pain that does not go away with the use of recommended medicines. °· You have trouble breathing (not caused by a stuffy nose). °MAKE SURE YOU:  °· Understand these instructions. °· Will watch your condition. °· Will get help right away if you are not doing well or get worse. °Document Released: 09/01/2005 Document Revised: 06/22/2013 Document Reviewed: 05/09/2013 °[employer]® Patient Information ©2015 [employer]. This information is not intended to replace advice given to you by your health care provider. Make sure you discuss any questions you have with your health care provider. ° °Salt Water Gargle °This solution will help make your mouth and throat feel better. °HOME CARE INSTRUCTIONS  °· Mix 1 teaspoon of salt in 8 ounces of warm water. °· Gargle with this solution as much or often as you need or as directed. Swish and gargle gently if you have any sores or wounds in your mouth. °· Do not swallow this mixture. °Document Released: 06/05/2004 Document Revised: 11/24/2011 Document Reviewed: 10/27/2008 °[employer]® Patient Information ©2015 [employer]. This information is not intended to replace advice given to you by your health care provider. Make sure you discuss any questions you have with your health care provider. ° °

## 2014-03-14 NOTE — ED Provider Notes (Signed)
CSN: [id number]     Arrival date & time 03/14/14  0840 History   First MD Initiated Contact with Patient 03/14/14 [phone]     Chief Complaint  Patient presents with  . URI   (Consider location/radiation/quality/duration/timing/severity/associated sxs/prior Treatment) HPI Comments: 28 year old male presents for evaluation of sore throat, ear pain, ear popping, bloody nasal secretions, posterior headache, dry and nonproductive cough, general body aches. His symptoms began 4 days ago and have gradually worsened today. He has been taking Mucinex and cough drops without much relief. He has no recent travel or sick contacts, although he does have a young son at home. He denies any exposure to strep throat. He has never had mono. Denies any fever, chest pain, shortness of breath, NVD, abdominal pain, sinus pain, sinus pressure  Patient is a 28 y.o. male presenting with URI.  URI Presenting symptoms: congestion, cough, ear pain, fatigue, rhinorrhea and sore throat   Presenting symptoms: no fever   Associated symptoms: arthralgias and myalgias     Past Medical History  Diagnosis Date  . Bronchitis   . Gout    History reviewed. No pertinent past surgical history. Family History  Problem Relation Age of Onset  . Hypertension Mother   . Diabetes Father    History  Substance Use Topics  . Smoking status: Current Every Day Smoker -- 0.50 packs/day    Types: Cigarettes  . Smokeless tobacco: Not on file  . Alcohol Use: Yes     Comment: occasional    Review of Systems  Constitutional: Positive for fatigue. Negative for fever and chills.  HENT: Positive for congestion, ear pain, rhinorrhea and sore throat. Negative for postnasal drip and sinus pressure.   Eyes: Negative for photophobia.  Respiratory: Positive for cough. Negative for chest tightness and shortness of breath.   Cardiovascular: Negative for chest pain, palpitations and leg swelling.  Gastrointestinal: Negative for nausea, vomiting  and diarrhea.  Genitourinary: Negative for flank pain.  Musculoskeletal: Positive for arthralgias, back pain and myalgias.  Skin: Negative for rash.  All other systems reviewed and are negative.   Allergies  Penicillins  Home Medications   Prior to Admission medications   Medication Sig Start Date End Date Taking? Authorizing Provider  GuaiFENesin (MUCINEX PO) Take by mouth.   Yes Historical Provider, MD  clindamycin (CLEOCIN) 300 MG capsule Take 1 capsule (300 mg total) by mouth 3 (three) times daily. 03/14/14   Adrian BlackwaterZachary H Baker, PA-C  guaiFENesin-codeine 100-10 MG/5ML syrup Take 10 mLs by mouth every 4 (four) hours as needed for cough. 03/14/14   Graylon GoodZachary H Baker, PA-C  indomethacin (INDOCIN) 25 MG capsule Take 1 capsule (25 mg total) by mouth 3 (three) times daily as needed. 12/04/13   Marissa Sciacca, PA-C  predniSONE (DELTASONE) 10 MG tablet 4 tabs PO QD for 4 days; 3 tabs PO QD for 3 days; 2 tabs PO QD for 2 days; 1 tab PO QD for 1 day 03/14/14   Graylon GoodZachary H Baker, PA-C  traMADol (ULTRAM) 50 MG tablet Take 1 tablet (50 mg total) by mouth every 6 (six) hours as needed. 12/04/13   Marissa Sciacca, PA-C   BP 151/84  Pulse 98  Temp(Src) 99.9 F (37.7 C) (Oral)  Resp 18  SpO2 96% Physical Exam  Nursing note and vitals reviewed. Constitutional: He is oriented to person, place, and time. He appears well-developed and well-nourished. No distress.  HENT:  Head: Normocephalic and atraumatic.  Right Ear: External ear and ear canal normal. A  middle ear effusion (serous) is present.  Left Ear: External ear and ear canal normal. A middle ear effusion ( serous) is present.  Nose: Nose normal. Right sinus exhibits no maxillary sinus tenderness and no frontal sinus tenderness. Left sinus exhibits no maxillary sinus tenderness and no frontal sinus tenderness.  Mouth/Throat: Uvula is midline and mucous membranes are normal. Oropharyngeal exudate and posterior oropharyngeal erythema present.  2+  tonsillar enlargement, symmetric bilaterally, with erythema and exudate  Neck: Normal range of motion. Neck supple.  Cardiovascular: Normal rate, regular rhythm and normal heart sounds.   Pulmonary/Chest: Effort normal and breath sounds normal. No respiratory distress.  Lymphadenopathy:    He has no cervical adenopathy.  Neurological: He is alert and oriented to person, place, and time. Coordination normal.  Skin: Skin is warm and dry. No rash noted. He is not diaphoretic.  Psychiatric: He has a normal mood and affect. Judgment normal.    ED Course  Procedures (including critical care time) Labs Review Labs Reviewed  CULTURE, GROUP A STREP  POCT RAPID STREP A ([facility] CARE ONLY)  POCT INFECTIOUS MONO SCREEN    Imaging Review No results found.   MDM   1. Exudative pharyngitis    Rapid strep and mono. Has the appearance of strep throat, will treat with clindamycin because penicillin allergic. Followup as needed  Meds ordered this encounter  Medications  . GuaiFENesin (MUCINEX PO)    Sig: Take by mouth.  Marland Kitchen. acetaminophen (TYLENOL) tablet 650 mg    Sig:   . clindamycin (CLEOCIN) 300 MG capsule    Sig: Take 1 capsule (300 mg total) by mouth 3 (three) times daily.    Dispense:  21 capsule    Refill:  0    Order Specific Question:  Supervising Provider    Answer:  Linna HoffKINDL, JAMES D [phone][[id number]]  . predniSONE (DELTASONE) 10 MG tablet    Sig: 4 tabs PO QD for 4 days; 3 tabs PO QD for 3 days; 2 tabs PO QD for 2 days; 1 tab PO QD for 1 day    Dispense:  30 tablet    Refill:  0    Order Specific Question:  Supervising Provider    Answer:  Linna HoffKINDL, JAMES D [phone][[id number]]  . guaiFENesin-codeine 100-10 MG/5ML syrup    Sig: Take 10 mLs by mouth every 4 (four) hours as needed for cough.    Dispense:  120 mL    Refill:  0    Order Specific Question:  Supervising Provider    Answer:  Bradd CanaryKINDL, JAMES D [[id number]]     Graylon GoodZachary H Baker, PA-C 03/14/14 1143

## 2014-03-14 NOTE — ED Notes (Signed)
Sore throat, headache, ear pain, blowing bloody secretions from head.  Non productive cough and generalized aches and pain

## 2014-03-16 LAB — CULTURE, GROUP A STREP

## 2014-08-21 ENCOUNTER — Emergency Department (HOSPITAL_COMMUNITY)
Admission: EM | Admit: 2014-08-21 | Discharge: 2014-08-21 | Disposition: A | Discharge status: Home / Self Care | Payer: Self-pay | Attending: Emergency Medicine | Admitting: Emergency Medicine

## 2014-08-21 ENCOUNTER — Encounter (HOSPITAL_COMMUNITY): Payer: Self-pay | Admitting: Emergency Medicine

## 2014-08-21 ENCOUNTER — Emergency Department (HOSPITAL_COMMUNITY): Payer: Self-pay

## 2014-08-21 DIAGNOSIS — Z720 Tobacco use: Secondary | ICD-10-CM | POA: Insufficient documentation

## 2014-08-21 DIAGNOSIS — B9789 Other viral agents as the cause of diseases classified elsewhere: Secondary | ICD-10-CM

## 2014-08-21 DIAGNOSIS — Z880 Allergy status to penicillin: Secondary | ICD-10-CM | POA: Insufficient documentation

## 2014-08-21 DIAGNOSIS — Z792 Long term (current) use of antibiotics: Secondary | ICD-10-CM | POA: Insufficient documentation

## 2014-08-21 DIAGNOSIS — M542 Cervicalgia: Secondary | ICD-10-CM | POA: Insufficient documentation

## 2014-08-21 DIAGNOSIS — M109 Gout, unspecified: Secondary | ICD-10-CM | POA: Insufficient documentation

## 2014-08-21 DIAGNOSIS — J069 Acute upper respiratory infection, unspecified: Secondary | ICD-10-CM | POA: Insufficient documentation

## 2014-08-21 MED ORDER — BENZONATATE 100 MG PO CAPS: 100.0000 mg | ORAL_CAPSULE | Freq: Three times a day (TID) | ORAL | Status: DC

## 2014-08-21 MED ORDER — LORATADINE 10 MG PO CAPS: 10.0000 mg | ORAL_CAPSULE | Freq: Every day | ORAL | Status: DC

## 2014-08-21 MED ORDER — FLUTICASONE PROPIONATE 50 MCG/ACT NA SUSP: 2.0000 | Freq: Every day | NASAL | Status: AC

## 2014-08-21 MED ORDER — ALBUTEROL SULFATE HFA 108 (90 BASE) MCG/ACT IN AERS: 1.0000 | INHALATION_SPRAY | Freq: Four times a day (QID) | RESPIRATORY_TRACT | Status: DC | PRN

## 2014-08-21 NOTE — Discharge Instructions (Signed)
Upper Respiratory Infection, Adult °An upper respiratory infection (URI) is also sometimes known as the common cold. The upper respiratory tract includes the nose, sinuses, throat, trachea, and bronchi. Bronchi are the airways leading to the lungs. Most people improve within 1 week, but symptoms can last up to 2 weeks. A residual cough may last even longer.  °CAUSES °Many different viruses can infect the tissues lining the upper respiratory tract. The tissues become irritated and inflamed and often become very moist. Mucus production is also common. A cold is contagious. You can easily spread the virus to others by oral contact. This includes kissing, sharing a glass, coughing, or sneezing. Touching your mouth or nose and then touching a surface, which is then touched by another person, can also spread the virus. °SYMPTOMS  °Symptoms typically develop 1 to 3 days after you come in contact with a cold virus. Symptoms vary from person to person. They may include: °· Runny nose. °· Sneezing. °· Nasal congestion. °· Sinus irritation. °· Sore throat. °· Loss of voice (laryngitis). °· Cough. °· Fatigue. °· Muscle aches. °· Loss of appetite. °· Headache. °· Low-grade fever. °DIAGNOSIS  °You might diagnose your own cold based on familiar symptoms, since most people get a cold 2 to 3 times a year. Your caregiver can confirm this based on your exam. Most importantly, your caregiver can check that your symptoms are not due to another disease such as strep throat, sinusitis, pneumonia, asthma, or epiglottitis. Blood tests, throat tests, and X-rays are not necessary to diagnose a common cold, but they may sometimes be helpful in excluding other more serious diseases. Your caregiver will decide if any further tests are required. °RISKS AND COMPLICATIONS  °You may be at risk for a more severe case of the common cold if you smoke cigarettes, have chronic heart disease (such as heart failure) or lung disease (such as asthma), or if  you have a weakened immune system. The very young and very old are also at risk for more serious infections. Bacterial sinusitis, middle ear infections, and bacterial pneumonia can complicate the common cold. The common cold can worsen asthma and chronic obstructive pulmonary disease (COPD). Sometimes, these complications can require emergency medical care and may be life-threatening. °PREVENTION  °The best way to protect against getting a cold is to practice good hygiene. Avoid oral or hand contact with people with cold symptoms. Wash your hands often if contact occurs. There is no clear evidence that vitamin C, vitamin E, echinacea, or exercise reduces the chance of developing a cold. However, it is always recommended to get plenty of rest and practice good nutrition. °TREATMENT  °Treatment is directed at relieving symptoms. There is no cure. Antibiotics are not effective, because the infection is caused by a virus, not by bacteria. Treatment may include: °· Increased fluid intake. Sports drinks offer valuable electrolytes, sugars, and fluids. °· Breathing heated mist or steam (vaporizer or shower). °· Eating chicken soup or other clear broths, and maintaining good nutrition. °· Getting plenty of rest. °· Using gargles or lozenges for comfort. °· Controlling fevers with ibuprofen or acetaminophen as directed by your caregiver. °· Increasing usage of your inhaler if you have asthma. °Zinc gel and zinc lozenges, taken in the first 24 hours of the common cold, can shorten the duration and lessen the severity of symptoms. Pain medicines may help with fever, muscle aches, and throat pain. A variety of non-prescription medicines are available to treat congestion and runny nose. Your caregiver   can make recommendations and may suggest nasal or lung inhalers for other symptoms.  °HOME CARE INSTRUCTIONS  °· Only take over-the-counter or prescription medicines for pain, discomfort, or fever as directed by your  caregiver. °· Use a warm mist humidifier or inhale steam from a shower to increase air moisture. This may keep secretions moist and make it easier to breathe. °· Drink enough water and fluids to keep your urine clear or pale yellow. °· Rest as needed. °· Return to work when your temperature has returned to normal or as your caregiver advises. You may need to stay home longer to avoid infecting others. You can also use a face mask and careful hand washing to prevent spread of the virus. °SEEK MEDICAL CARE IF:  °· After the first few days, you feel you are getting worse rather than better. °· You need your caregiver's advice about medicines to control symptoms. °· You develop chills, worsening shortness of breath, or brown or red sputum. These may be signs of pneumonia. °· You develop yellow or brown nasal discharge or pain in the face, especially when you bend forward. These may be signs of sinusitis. °· You develop a fever, swollen neck glands, pain with swallowing, or white areas in the back of your throat. These may be signs of strep throat. °SEEK IMMEDIATE MEDICAL CARE IF:  °· You have a fever. °· You develop severe or persistent headache, ear pain, sinus pain, or chest pain. °· You develop wheezing, a prolonged cough, cough up blood, or have a change in your usual mucus (if you have chronic lung disease). °· You develop sore muscles or a stiff neck. °Document Released: 02/25/2001 Document Revised: 11/24/2011 Document Reviewed: 12/07/2013 °ExitCare® Patient Information ©2015 [employer]. This information is not intended to replace advice given to you by your health care provider. Make sure you discuss any questions you have with your health care provider. ° °Emergency Department Resource Guide °1) Find a Doctor and Pay Out of Pocket °Although you won't have to find out who is covered by your insurance plan, it is a good idea to ask around and get recommendations. You will then need to call the office and see if  the doctor you have chosen will accept you as a new patient and what types of options they offer for patients who are self-pay. Some doctors offer discounts or will set up payment plans for their patients who do not have insurance, but you will need to ask so you aren't surprised when you get to your appointment. ° °2) Contact Your Local Health Department °Not all health departments have doctors that can see patients for sick visits, but many do, so it is worth a call to see if yours does. If you don't know where your local health department is, you can check in your phone book. The CDC also has a tool to help you locate your state's health department, and many state websites also have listings of all of their local health departments. ° °3) Find a Walk-in Clinic °If your illness is not likely to be very severe or complicated, you may want to try a walk in clinic. These are popping up all over the country in pharmacies, drugstores, and shopping centers. They're usually staffed by nurse practitioners or physician assistants that have been trained to treat common illnesses and complaints. They're usually fairly quick and inexpensive. However, if you have serious medical issues or chronic medical problems, these are probably not your best option. ° °  No Primary Care Doctor: °- Call [facility] at  832-8000 - they can help you locate a primary care doctor that  accepts your insurance, provides certain services, etc. °- Physician Referral Service- 1-800-533-3463 ° °Chronic Pain Problems: °Organization         Address  Phone   Notes  °[facility]  (336) 297-2271 Patients need to be referred by their primary care doctor.  ° °Medication Assistance: °Organization         Address  Phone   Notes  °[place] [facility] [address] °[place], [place] [address] (336) 641-8030 --Must be a resident of [place] °-- Must have NO insurance coverage whatsoever (no  Medicaid/ Medicare, etc.) °-- The pt. MUST have a primary care doctor that directs their care regularly and follows them in the community °  °[facility]  (866) 331-1348   °[employer]  (888) 892-1162   ° °Agencies that provide inexpensive medical care: °Organization         Address  Phone   Notes  °[facility]  (336) 832-8035   °[facility]    (336) 832-7272   °[facility] [address] °[place], [place] [address] (336) 832-4777   °[facility] [address], °[place] (336) 271-4999   °[facility]    (336) 373-0678   °[facility]    (336) 272-1050   °[facility] ° [address], [place] Phone:  (336) 832-4444, Fax:  (336) 832-4440 Hours of Operation:  9 am - 6 pm, M-F.  Also accepts Medicaid/Medicare and self-pay.  °[facility] ° [address], [place] Phone: (336) [phone], Fax: (336) [phone]. Hours of Operation:  8:30 am - 5:30 pm, M-F.  Also accepts Medicaid and self-pay.  °[facility] [place] [address], [place] Phone: (336) 878-6027   °[facility] [address], [place], [place] (336)723-1848, Ext. 123 Mondays & Thursdays: 7-9 AM.  First 15 patients are seen on a first come, first serve basis. °  ° °Medicaid-accepting [place] Providers: ° °Organization         Address  Phone   Notes  °[facility] [address], [place] (336) 641-2100 Also accepts self-pay patients.  °[facility] [address], [place] ° (336) 856-9996   °[facility] [address], [place] (336) 288-8857   °[facility] [address] [place] [address], [place] (336) 299-7000   °Veita Bland [address], [place]  ° (336) 373-1557 Only accepts [place] Access Medicaid patients after they have their name applied to their card.   ° °Self-Pay (no insurance) in [place]: ° °Organization         Address  Phone   Notes  °Sickle Cell Patients, [facility] [address], [place] (336) 832-1970   °[facility] [address], [place] (336) 832-4400   °[facility] [place] ° [address], [place] (336) 992-4800   °[facility]/Dr. Osei-Bonsu ° [address] [place] [address], [place] or [address], [place] (336) 841-8500 Phone number for both [place] and [place] locations is the same.  °[facility] [address], [place] (336) 299-0000   °  [facility] [address] [place] [address], [place] or [address] (336) 852-7530 °(336) 878-2260   °[facility] [address], [place] (336) 350-1642, phone; (336) 294-5005, fax Sees patients 1st and 3rd Saturday of every month.  Must not qualify for public or private insurance (i.e. Medicaid, Medicare, [place] Health Choice, Veterans' Benefits) • Household income should be no more than 200% of the poverty level •The clinic cannot treat you if you are pregnant or think you are pregnant • Sexually transmitted diseases are not treated at the clinic.  ° ° °Dental Care: °Organization         Address  Phone  Notes  °[facility] [address], [place] (336) 641-6152 Accepts children up to age 21 who are enrolled in Medicaid or [place] Health Choice; pregnant women with a Medicaid card; and children who have applied for Medicaid or [place] Health Choice, but were declined, whose parents can pay a reduced fee at time of service.  °[facility] [place]  [address], [place] (336) 641-7733 Accepts children up to age 21 who are enrolled in Medicaid or [place] Health Choice; pregnant women with a Medicaid card; and children who have applied for Medicaid or [place] Health Choice,  but were declined, whose parents can pay a reduced fee at time of service.  °[facility] ° [address], [place] (336) 641-4533 Patients are seen by appointment only. Walk-ins are not accepted. [facility] will see patients 18 years of age and older. °Monday - Tuesday (8am-5pm) °Most Wednesdays (8:30-5pm) °$30 per visit, cash only  °[facility] ° [address], [place] (336) 641-4533 Patients are seen by appointment only. Walk-ins are not accepted. [facility] will see patients 18 years of age and older. °One Wednesday Evening (Monthly: Volunteer Based).  $30 per visit, cash only  °[facility]  (919) 537-3737 for adults; Children under age 4, call Graduate Pediatric Dentistry at ([phone]) [phone]. Children aged 4-14, please call ([phone]) [phone] to request a pediatric application. ° Dental services are provided in all areas of dental care including fillings, crowns and bridges, complete and partial dentures, implants, gum treatment, root canals, and extractions. Preventive care is also provided. Treatment is provided to both adults and children. °Patients are selected via a lottery and there is often a waiting list. °  °[facility] [address], °[place] ° (336) 763-8833 [email or web address] °  °[facility] [address], [place], [place] (336)723-1848, Ext. 123 Second and Fourth Thursday of each month, opens at 6:30 AM; Clinic ends at 9 AM.  Patients are seen on a first-come first-served basis, and a limited number are seen during each clinic.  ° °[facility] ° [address], [place], [place] (336) 723-7904   Eligibility Requirements °You must have lived in [place], [place], or [place] counties for at least the last three months. °  You cannot be eligible for state or federal sponsored healthcare insurance, including Veterans Administration, Medicaid, or Medicare. °  You generally  cannot be eligible for healthcare insurance through your employer.  °  How to apply: °Eligibility screenings are held every Tuesday and Wednesday afternoon from 1:00 pm until 4:00 pm. You do not need an appointment for the interview!  °[facility] [address], [place], [place] 336-631-2330   °  [facility]  336-342-8273   °[facility]  336-703-3100   °[facility]  336-570-6415   ° °Behavioral Health Resources in the Community: °Intensive Outpatient Programs °Organization         Address  Phone  Notes  °[facility] [address], [place], [place] 336-878-6098   °[facility] [address], [place], [place] 336-832-9800   °[facility] [address], [place], [place] ° 336-882-2125   °[facility] [address],  °[place], [place] 1-800-853-5163 or 336-641-4981   °Substance Abuse Resources °Organization         Address  Phone  Notes  °[facility]  336-882-2125   °[facility]  336-784-9470   °[facility]  336-285-9073   °[facility]  336-845-3988   °Residential & Outpatient Substance Abuse Program  1-800-659-3381   °Psychological Services °Organization         Address  Phone  Notes  °[facility]  336- 832-9600   °[facility]  336- 378-7881   °[facility] [address], [place] 1-800-853-5163 or 336-641-4981   ° °Mobile Crisis Teams °Organization         Address  Phone  Notes  °[facility], Mobile Crisis Care Unit  1-877-626-1772   °Assertive °[facility] ° [address] [place], [place] 336-834-9664   °Sharon DeEsch [address] °[place] 336-554-5454   ° °Self-Help/Support Groups °Organization         Address  Phone             Notes  °[facility] - variety of support groups  336- 373-1402 Call for more  information  °Narcotics Anonymous (NA), [facility] [address], °[place]  2 meetings at this location  ° °Residential Treatment Programs °Organization         Address  Phone  Notes  °[facility] [address],    °[place]  1-866-801-8205   °[facility] ° [address], [place], [place] 704-293-8524   °[facility] [address], [place] 336-845-3988 Admissions: 8am-3pm M-F  °[facility] [address]    °[place], [place], [place] 336-841-1104   °[facility] [address], [place], [place] 336-379-7146   °[facility] [address]  °[place], [place] 336-285-9073   °[facility] - Intensive Outpatient [address], [place], [place] 336-852-3033   °[facility] [address]  °[place], [place] 1-877-615-2722 or 336-784-9470   °[facility] [address] [place], [place] 336-227-7417 Accepts Medicaid  °[facility] [address]  °[place] 1-800-659-3381 Substance Abuse/Addiction Treatment  ° °[place] Behavioral Health Resources °Organization         Address  Phone  Notes  °[facility]  (888) 581-9988   °Julie Brannon, PhD [address] [place], [place]   (336) 349-5553 or (336) 951-0000   °[facility]   [address] °[place], [place] (336) 349-4454   °[facility] [address], [place], [place] (336) 342-8316 Insurance/Medicaid/sponsorship through [facility]  °[facility] [address]                                      [place], [place] (336) 342-8316 Therapy/tele-psych/case  °[facility] [address]  ° [place], [place] (336) 349-2233    °Dr. Arfeen  (336) 349-4544   °[facility] [place]  [employer] [place] [facility] 1) [address], [place] °2) [address], [place] °3)  [address], [place] (336)  349-3220 °(336) 342-7768 ° °(336) 342-8140   °[place] [facility] (336) 342-1394 or (336) 342-3537 (After Hours)    ° ° °

## 2014-08-21 NOTE — ED Provider Notes (Signed)
CSN: [id number]     Arrival date & time 08/21/14  1331 History  This chart was scribed for non-physician practitioner, Terri Piedraourtney Forcucci, PA-C working with Rolan BuccoMelanie Belfi, MD by Greggory StallionKayla Andersen, ED scribe. This patient was seen in room WTR6/WTR6 and the patient's care was started at 2:57 PM.   Chief Complaint  Patient presents with  . Cough  . Headache   The history is provided by the patient. No language interpreter was used.    HPI Comments: Jimmy Jordan is a 28 y.o. male who presents to the Emergency Department complaining of cough that started over one month ago. Cough was productive at first but is now nonproductive. Reports associated SOB, nasal congestion, intermittent headaches and neck stiffness. States SOB is mainly when he lays down to go to sleep. Pt has taken mucinex and Theraflu with temporary relief. Denies fever, chills, ear pain, sore throat, nausea, emesis, neck pain, rashes.   Past Medical History  Diagnosis Date  . Bronchitis   . Gout    History reviewed. No pertinent past surgical history. Family History  Problem Relation Age of Onset  . Hypertension Mother   . Diabetes Father    History  Substance Use Topics  . Smoking status: Current Every Day Smoker -- 0.50 packs/day    Types: Cigarettes  . Smokeless tobacco: Not on file  . Alcohol Use: Yes     Comment: occasional    Review of Systems  Constitutional: Negative for fever and chills.  HENT: Positive for congestion and sinus pressure. Negative for ear pain and sore throat.   Respiratory: Positive for cough and shortness of breath.   Gastrointestinal: Negative for nausea and vomiting.  Musculoskeletal: Positive for neck stiffness. Negative for neck pain.  Skin: Negative for rash.  Neurological: Positive for headaches.  All other systems reviewed and are negative.  Allergies  Penicillins  Home Medications   Prior to Admission medications   Medication Sig Start Date End Date Taking? Authorizing  Provider  albuterol (PROVENTIL HFA;VENTOLIN HFA) 108 (90 BASE) MCG/ACT inhaler Inhale 1-2 puffs into the lungs every 6 (six) hours as needed for wheezing or shortness of breath. 08/21/14   Tyiesha Brackney A Forcucci, PA-C  benzonatate (TESSALON) 100 MG capsule Take 1 capsule (100 mg total) by mouth every 8 (eight) hours. 08/21/14   Brynlee Pennywell A Forcucci, PA-C  clindamycin (CLEOCIN) 300 MG capsule Take 1 capsule (300 mg total) by mouth 3 (three) times daily. 03/14/14   Adrian BlackwaterZachary H Baker, PA-C  fluticasone (FLONASE) 50 MCG/ACT nasal spray Place 2 sprays into both nostrils daily. 08/21/14   Rees Matura A Forcucci, PA-C  GuaiFENesin (MUCINEX PO) Take by mouth.    Historical Provider, MD  guaiFENesin-codeine 100-10 MG/5ML syrup Take 10 mLs by mouth every 4 (four) hours as needed for cough. 03/14/14   Graylon GoodZachary H Baker, PA-C  indomethacin (INDOCIN) 25 MG capsule Take 1 capsule (25 mg total) by mouth 3 (three) times daily as needed. 12/04/13   Marissa Sciacca, PA-C  Loratadine 10 MG CAPS Take 1 capsule (10 mg total) by mouth daily. 08/21/14   Kashis Penley A Forcucci, PA-C  predniSONE (DELTASONE) 10 MG tablet 4 tabs PO QD for 4 days; 3 tabs PO QD for 3 days; 2 tabs PO QD for 2 days; 1 tab PO QD for 1 day 03/14/14   Graylon GoodZachary H Baker, PA-C  traMADol (ULTRAM) 50 MG tablet Take 1 tablet (50 mg total) by mouth every 6 (six) hours as needed. 12/04/13   Raymon MuttonMarissa Sciacca, PA-C  BP 140/77 mmHg  Pulse 103  Temp(Src) 97.7 F (36.5 C) (Oral)  Resp 18  SpO2 97%   Physical Exam  Constitutional: He is oriented to person, place, and time. He appears well-developed and well-nourished. No distress.  HENT:  Head: Normocephalic and atraumatic.  Right Ear: Tympanic membrane normal.  Left Ear: Tympanic membrane normal.  Nose: Mucosal edema present. Right sinus exhibits no maxillary sinus tenderness and no frontal sinus tenderness. Left sinus exhibits no maxillary sinus tenderness and no frontal sinus tenderness.  Mouth/Throat: Oropharynx is clear  and moist.  Eyes: Conjunctivae and EOM are normal. Pupils are equal, round, and reactive to light.  Neck: Neck supple. No tracheal deviation present. No Brudzinski's sign and no Kernig's sign noted.  Cardiovascular: Normal rate, regular rhythm and normal heart sounds.  Exam reveals no gallop and no friction rub.   No murmur heard. Pulmonary/Chest: Effort normal and breath sounds normal. No respiratory distress. He has no wheezes. He has no rhonchi. He has no rales.  Musculoskeletal: Normal range of motion.  Lymphadenopathy:    He has no cervical adenopathy.       Right: No supraclavicular adenopathy present.       Left: No supraclavicular adenopathy present.  Neurological: He is alert and oriented to person, place, and time. He has normal strength. No cranial nerve deficit or sensory deficit.  Skin: Skin is warm and dry.  Psychiatric: He has a normal mood and affect. His behavior is normal.  Nursing note and vitals reviewed.   ED Course  Procedures (including critical care time)  DIAGNOSTIC STUDIES: Oxygen Saturation is 97% on RA, normal by my interpretation.    COORDINATION OF CARE: 3:04 PM-Discussed treatment plan which includes nasal saline spray, Flonase, an antihistamine, an albuterol inhaler and tessalon perles with pt at bedside and pt agreed to plan. Will give pt PCP referrals and advised him to follow up. Return precautions given.  Labs Review Labs Reviewed - No data to display  Imaging Review Dg Chest 2 View  08/21/2014   CLINICAL DATA:  Worsening cough for 1 month ,shortness of Breath  EXAM: CHEST  2 VIEW  COMPARISON:  06/11/2005  FINDINGS: Cardiac shadow is stable. The lungs are well aerated bilaterally. No focal infiltrate or sizable effusion is seen. No acute bony abnormality is noted.  IMPRESSION: No active cardiopulmonary disease.   Electronically Signed   By: Alcide CleverMark  Lukens M.D.   On: 08/21/2014 14:36     EKG Interpretation None      MDM   Final diagnoses:   Viral URI with cough   Patient is a 28 year old male who presents emergency room for evaluation of cough, headache, and congestion. Physical exam reveals alert nontoxic-appearing male with normal vital signs. Chest x-ray reveals no evidence of pneumonia here. Suspect that this is likely viral bronchitis versus upper respiratory infection with cough. There are no meningeal signs. We'll discharge home with symptomatic treatment as seen above. Patient to return for worsening shortness of breath, fevers that do not respond medications, or any other concerning symptoms. He states understanding and agreement at this time. Patient is stable for discharge.  I personally performed the services described in this documentation, which was scribed in my presence. The recorded information has been reviewed and is accurate.  Eben Burowourtney A Forcucci, PA-C 08/21/14 1513  Rolan BuccoMelanie Belfi, MD 08/21/14 [phone]

## 2014-08-21 NOTE — ED Notes (Signed)
Patient transported to X-ray 

## 2014-08-21 NOTE — ED Notes (Signed)
Per pt, states cough for over a month-head pain from sinuses and cough

## 2014-11-14 DIAGNOSIS — E119 Type 2 diabetes mellitus without complications: Secondary | ICD-10-CM

## 2014-11-14 HISTORY — DX: Type 2 diabetes mellitus without complications: E11.9

## 2014-11-15 ENCOUNTER — Encounter (HOSPITAL_COMMUNITY): Payer: Self-pay | Admitting: *Deleted

## 2014-11-15 ENCOUNTER — Emergency Department (HOSPITAL_COMMUNITY): Payer: Self-pay

## 2014-11-15 ENCOUNTER — Inpatient Hospital Stay (HOSPITAL_COMMUNITY)
Admission: EM | Admit: 2014-11-15 | Discharge: 2014-11-17 | DRG: 638 | Disposition: A | Discharge status: Home / Self Care | Payer: Self-pay | Attending: Internal Medicine | Admitting: Internal Medicine

## 2014-11-15 DIAGNOSIS — E1300 Other specified diabetes mellitus with hyperosmolarity without nonketotic hyperglycemic-hyperosmolar coma (NKHHC): Secondary | ICD-10-CM

## 2014-11-15 DIAGNOSIS — R059 Cough, unspecified: Secondary | ICD-10-CM

## 2014-11-15 DIAGNOSIS — R030 Elevated blood-pressure reading, without diagnosis of hypertension: Secondary | ICD-10-CM

## 2014-11-15 DIAGNOSIS — M109 Gout, unspecified: Secondary | ICD-10-CM | POA: Diagnosis present

## 2014-11-15 DIAGNOSIS — R05 Cough: Secondary | ICD-10-CM

## 2014-11-15 DIAGNOSIS — I10 Essential (primary) hypertension: Secondary | ICD-10-CM | POA: Diagnosis present

## 2014-11-15 DIAGNOSIS — E871 Hypo-osmolality and hyponatremia: Secondary | ICD-10-CM | POA: Diagnosis present

## 2014-11-15 DIAGNOSIS — J40 Bronchitis, not specified as acute or chronic: Secondary | ICD-10-CM | POA: Diagnosis present

## 2014-11-15 DIAGNOSIS — IMO0002 Reserved for concepts with insufficient information to code with codable children: Secondary | ICD-10-CM | POA: Diagnosis present

## 2014-11-15 DIAGNOSIS — E860 Dehydration: Secondary | ICD-10-CM | POA: Diagnosis present

## 2014-11-15 DIAGNOSIS — Z79899 Other long term (current) drug therapy: Secondary | ICD-10-CM

## 2014-11-15 DIAGNOSIS — Z23 Encounter for immunization: Secondary | ICD-10-CM

## 2014-11-15 DIAGNOSIS — E1165 Type 2 diabetes mellitus with hyperglycemia: Principal | ICD-10-CM | POA: Diagnosis present

## 2014-11-15 DIAGNOSIS — F17210 Nicotine dependence, cigarettes, uncomplicated: Secondary | ICD-10-CM | POA: Diagnosis present

## 2014-11-15 DIAGNOSIS — N179 Acute kidney failure, unspecified: Secondary | ICD-10-CM | POA: Diagnosis present

## 2014-11-15 DIAGNOSIS — E1139 Type 2 diabetes mellitus with other diabetic ophthalmic complication: Secondary | ICD-10-CM

## 2014-11-15 DIAGNOSIS — E873 Alkalosis: Secondary | ICD-10-CM | POA: Diagnosis present

## 2014-11-15 DIAGNOSIS — E870 Hyperosmolality and hypernatremia: Secondary | ICD-10-CM

## 2014-11-15 LAB — URINALYSIS, ROUTINE W REFLEX MICROSCOPIC
BILIRUBIN URINE: NEGATIVE
Hgb urine dipstick: NEGATIVE
KETONES UR: NEGATIVE mg/dL
Leukocytes, UA: NEGATIVE
Nitrite: NEGATIVE
PH: 5.5 (ref 5.0–8.0)
PROTEIN: NEGATIVE mg/dL
Specific Gravity, Urine: 1.026 (ref 1.005–1.030)
Urobilinogen, UA: 0.2 mg/dL (ref 0.0–1.0)

## 2014-11-15 LAB — I-STAT CHEM 8, ED
BUN: 18 mg/dL (ref 6–23)
Calcium, Ion: 1.23 mmol/L (ref 1.12–1.23)
Chloride: 92 mmol/L — ABNORMAL LOW (ref 96–112)
Creatinine, Ser: 1.7 mg/dL — ABNORMAL HIGH (ref 0.50–1.35)
Glucose, Bld: >700 mg/dL (ref 70–99)
HCT: 48 % (ref 39.0–52.0)
HEMOGLOBIN: 16.3 g/dL (ref 13.0–17.0)
Potassium: 5.2 mmol/L — ABNORMAL HIGH (ref 3.5–5.1)
SODIUM: 127 mmol/L — AB (ref 135–145)
TCO2: 20 mmol/L (ref 0–100)

## 2014-11-15 LAB — CBC
HCT: 42.9 % (ref 39.0–52.0)
Hemoglobin: 14.9 g/dL (ref 13.0–17.0)
MCH: 29.6 pg (ref 26.0–34.0)
MCHC: 34.7 g/dL (ref 30.0–36.0)
MCV: 85.1 fL (ref 78.0–100.0)
PLATELETS: 267 10*3/uL (ref 150–400)
RBC: 5.04 MIL/uL (ref 4.22–5.81)
RDW: 13.6 % (ref 11.5–15.5)
WBC: 7.2 10*3/uL (ref 4.0–10.5)

## 2014-11-15 LAB — BLOOD GAS, VENOUS
ACID-BASE DEFICIT: 4.7 mmol/L — AB (ref 0.0–2.0)
BICARBONATE: 20.5 meq/L (ref 20.0–24.0)
FIO2: 0.21 %
O2 Saturation: 95.7 %
PATIENT TEMPERATURE: 98.6
PH VEN: 7.329 — AB (ref 7.250–7.300)
TCO2: 18.1 mmol/L (ref 0–100)
pCO2, Ven: 40.1 mmHg — ABNORMAL LOW (ref 45.0–50.0)
pO2, Ven: 85.7 mmHg — ABNORMAL HIGH (ref 30.0–45.0)

## 2014-11-15 LAB — CK: Total CK: 239 U/L — ABNORMAL HIGH (ref 7–232)

## 2014-11-15 LAB — COMPREHENSIVE METABOLIC PANEL
ALK PHOS: 96 U/L (ref 39–117)
ALT: 85 U/L — ABNORMAL HIGH (ref 0–53)
AST: 61 U/L — AB (ref 0–37)
Albumin: 4.6 g/dL (ref 3.5–5.2)
Anion gap: 12 (ref 5–15)
BILIRUBIN TOTAL: 0.8 mg/dL (ref 0.3–1.2)
BUN: 17 mg/dL (ref 6–23)
CALCIUM: 9.3 mg/dL (ref 8.4–10.5)
CHLORIDE: 89 mmol/L — AB (ref 96–112)
CO2: 22 mmol/L (ref 19–32)
Creatinine, Ser: 1.81 mg/dL — ABNORMAL HIGH (ref 0.50–1.35)
GFR calc Af Amer: 56 mL/min — ABNORMAL LOW (ref >90)
GFR, EST NON AFRICAN AMERICAN: 49 mL/min — AB (ref >90)
Glucose, Bld: 932 mg/dL (ref 70–99)
POTASSIUM: 5.1 mmol/L (ref 3.5–5.1)
Sodium: 123 mmol/L — ABNORMAL LOW (ref 135–145)
Total Protein: 7.9 g/dL (ref 6.0–8.3)

## 2014-11-15 LAB — CBG MONITORING, ED
GLUCOSE-CAPILLARY: 481 mg/dL — AB (ref 70–99)
Glucose-Capillary: >600 mg/dL (ref 70–99)

## 2014-11-15 LAB — LIPASE, BLOOD: LIPASE: 67 U/L — AB (ref 11–59)

## 2014-11-15 LAB — I-STAT TROPONIN, ED: TROPONIN I, POC: 0 ng/mL (ref 0.00–0.08)

## 2014-11-15 LAB — URINE MICROSCOPIC-ADD ON: URINE-OTHER: NONE SEEN

## 2014-11-15 MED ORDER — SODIUM CHLORIDE 0.9 % IV SOLN
INTRAVENOUS | Status: DC
  Administered 2014-11-15: 8.7 [IU]/h via INTRAVENOUS
  Filled 2014-11-15: qty 2.5

## 2014-11-15 MED ORDER — SODIUM CHLORIDE 0.9 % IV BOLUS (SEPSIS)
2000.0000 mL | Freq: Once | INTRAVENOUS | Status: AC
  Administered 2014-11-15: 2000 mL via INTRAVENOUS

## 2014-11-15 MED ORDER — DEXTROSE-NACL 5-0.45 % IV SOLN: INTRAVENOUS | Status: DC

## 2014-11-15 NOTE — H&P (Signed)
PCP: none   Chief Complaint:  Cough, blurred vision  HPI: Jimmy Jordan is a 29 y.o. male   has a past medical history of Bronchitis; Gout; and Gout.   Presented with  Patient have had a cold symptoms for the past 2-3 weeks he reports in creased urination some blurred vision. Reports decreased energy. Patient presented to ER and was found to have glucose 932, Cr 1.8 and Sodium 123 Patient had no prior hx of DM but has family hx of DM. Patient has been diagnosed with bronchitis before. He continues to smoke. Denies any history of asthma. Hospitalist was called for admission for new onset DM2 with hyperglycemia  Review of Systems:    Pertinent positives include:  fatigue, abdominal pain, nausea, vomiting, increased urination  Constitutional:  No weight loss, night sweats, Fevers, chills,weight loss  HEENT:  No headaches, Difficulty swallowing,Tooth/dental problems,Sore throat,  No sneezing, itching, ear ache, nasal congestion, post nasal drip,  Cardio-vascular:  No chest pain, Orthopnea, PND, anasarca, dizziness, palpitations.no Bilateral lower extremity swelling  GI:  No heartburn, indigestion,  diarrhea, change in bowel habits, loss of appetite, melena, blood in stool, hematemesis Resp:  no shortness of breath at rest. No dyspnea on exertion, No excess mucus, no productive cough, No non-productive cough, No coughing up of blood.No change in color of mucus.No wheezing. Skin:  no rash or lesions. No jaundice GU:  no dysuria, change in color of urine, no urgency or frequency. No straining to urinate.  No flank pain.  Musculoskeletal:  No joint pain or no joint swelling. No decreased range of motion. No back pain.  Psych:  No change in mood or affect. No depression or anxiety. No memory loss.  Neuro: no localizing neurological complaints, no tingling, no weakness, no double vision, no gait abnormality, no slurred speech, no confusion  Otherwise ROS are negative except for  above, 10 systems were reviewed  Past Medical History: Past Medical History  Diagnosis Date  . Bronchitis   . Gout   . Gout    History reviewed. No pertinent past surgical history.   Medications: Prior to Admission medications   Medication Sig Start Date End Date Taking? Authorizing Provider  albuterol (PROVENTIL HFA;VENTOLIN HFA) 108 (90 BASE) MCG/ACT inhaler Inhale 1-2 puffs into the lungs every 6 (six) hours as needed for wheezing or shortness of breath. 08/21/14  Yes Courtney Forcucci, PA-C  Chlorphen-Pseudoephed-APAP (THERAFLU FLU/COLD PO) Take 30 mLs by mouth daily as needed (cold symptoms).   Yes Historical Provider, MD  GuaiFENesin (MUCINEX PO) Take by mouth.   Yes Historical Provider, MD  benzonatate (TESSALON) 100 MG capsule Take 1 capsule (100 mg total) by mouth every 8 (eight) hours. Patient not taking: Reported on 11/15/2014 08/21/14   Toni Amend Forcucci, PA-C  clindamycin (CLEOCIN) 300 MG capsule Take 1 capsule (300 mg total) by mouth 3 (three) times daily. Patient not taking: Reported on 11/15/2014 03/14/14   Graylon Good, PA-C  fluticasone [facility]) 50 MCG/ACT nasal spray Place 2 sprays into both nostrils daily. Patient not taking: Reported on 11/15/2014 08/21/14   Toni Amend Forcucci, PA-C  guaiFENesin-codeine 100-10 MG/5ML syrup Take 10 mLs by mouth every 4 (four) hours as needed for cough. Patient not taking: Reported on 11/15/2014 03/14/14   Graylon Good, PA-C  indomethacin (INDOCIN) 25 MG capsule Take 1 capsule (25 mg total) by mouth 3 (three) times daily as needed. Patient not taking: Reported on 11/15/2014 12/04/13   Marissa Sciacca, PA-C  Loratadine 10 MG  CAPS Take 1 capsule (10 mg total) by mouth daily. Patient not taking: Reported on 11/15/2014 08/21/14   Terri Piedraourtney Forcucci, PA-C  predniSONE (DELTASONE) 10 MG tablet 4 tabs PO QD for 4 days; 3 tabs PO QD for 3 days; 2 tabs PO QD for 2 days; 1 tab PO QD for 1 day Patient not taking: Reported on 11/15/2014 03/14/14   Graylon GoodZachary H  Baker, PA-C  traMADol (ULTRAM) 50 MG tablet Take 1 tablet (50 mg total) by mouth every 6 (six) hours as needed. Patient not taking: Reported on 11/15/2014 12/04/13   Raymon MuttonMarissa Sciacca, PA-C    Allergies:   Allergies  Allergen Reactions  . Penicillins Other (See Comments)    childhood    Social History:  Ambulatory   independently   Lives at home With family     reports that he has been smoking Cigarettes.  He has been smoking about 0.50 packs per day. He does not have any smokeless tobacco history on file. He reports that he drinks alcohol. He reports that he does not use illicit drugs.    Family History: family history includes Diabetes in his father; Hypertension in his mother.    Physical Exam: Patient Vitals for the past 24 hrs:  BP Temp Temp src Pulse Resp SpO2 Height Weight  11/15/14 2007 163/87 mmHg 97.5 F (36.4 C) Oral 109 20 98 % 6\' 1"  (1.854 m) 131.543 kg (290 lb)    1. General:  in No Acute distress 2. Psychological: Alert and Oriented 3. Head/ENT:   Moist   Mucous Membranes                          Head Non traumatic, neck supple                          Normal  Dentition 4. SKIN:   decreased Skin turgor,  Skin clean Dry and intact no rash 5. Heart: Regular rate and rhythm no Murmur, Rub or gallop 6. Lungs: Clear to auscultation bilaterally, no wheezes or crackles   7. Abdomen: Soft, non-tender, Non distended, obesity 8. Lower extremities: no clubbing, cyanosis, or edema 9. Neurologically Grossly intact, moving all 4 extremities equally 10. MSK: Normal range of motion  body mass index is 38.27 kg/(m^2).   Labs on Admission:   Results for orders placed or performed during the hospital encounter of 11/15/14 (from the past 24 hour(s))  POC CBG, ED     Status: Abnormal   Collection Time: 11/15/14  8:13 PM  Result Value Ref Range   Glucose-Capillary >600 (HH) 70 - 99 mg/dL  Urinalysis, Routine w reflex microscopic     Status: Abnormal   Collection Time:  11/15/14  8:20 PM  Result Value Ref Range   Color, Urine YELLOW YELLOW   APPearance CLEAR CLEAR   Specific Gravity, Urine 1.026 1.005 - 1.030   pH 5.5 5.0 - 8.0   Glucose, UA >1000 (A) NEGATIVE mg/dL   Hgb urine dipstick NEGATIVE NEGATIVE   Bilirubin Urine NEGATIVE NEGATIVE   Ketones, ur NEGATIVE NEGATIVE mg/dL   Protein, ur NEGATIVE NEGATIVE mg/dL   Urobilinogen, UA 0.2 0.0 - 1.0 mg/dL   Nitrite NEGATIVE NEGATIVE   Leukocytes, UA NEGATIVE NEGATIVE  Urine microscopic-add on     Status: None   Collection Time: 11/15/14  8:20 PM  Result Value Ref Range   Urine-Other      NO FORMED ELEMENTS  SEEN ON URINE MICROSCOPIC EXAMINATION  CBC     Status: None   Collection Time: 11/15/14  8:28 PM  Result Value Ref Range   WBC 7.2 4.0 - 10.5 K/uL   RBC 5.04 4.22 - 5.81 MIL/uL   Hemoglobin 14.9 13.0 - 17.0 g/dL   HCT 96.0 45.4 - 09.8 %   MCV 85.1 78.0 - 100.0 fL   MCH 29.6 26.0 - 34.0 pg   MCHC 34.7 30.0 - 36.0 g/dL   RDW 11.9 14.7 - 82.9 %   Platelets 267 150 - 400 K/uL  Comprehensive metabolic panel     Status: Abnormal   Collection Time: 11/15/14  8:28 PM  Result Value Ref Range   Sodium 123 (L) 135 - 145 mmol/L   Potassium 5.1 3.5 - 5.1 mmol/L   Chloride 89 (L) 96 - 112 mmol/L   CO2 22 19 - 32 mmol/L   Glucose, Bld 932 (HH) 70 - 99 mg/dL   BUN 17 6 - 23 mg/dL   Creatinine, Ser 5.62 (H) 0.50 - 1.35 mg/dL   Calcium 9.3 8.4 - 13.0 mg/dL   Total Protein 7.9 6.0 - 8.3 g/dL   Albumin 4.6 3.5 - 5.2 g/dL   AST 61 (H) 0 - 37 U/L   ALT 85 (H) 0 - 53 U/L   Alkaline Phosphatase 96 39 - 117 U/L   Total Bilirubin 0.8 0.3 - 1.2 mg/dL   GFR calc non Af Amer 49 (L) >90 mL/min   GFR calc Af Amer 56 (L) >90 mL/min   Anion gap 12 5 - 15  Blood gas, venous     Status: Abnormal   Collection Time: 11/15/14  8:32 PM  Result Value Ref Range   FIO2 0.21 %   Delivery systems ROOMAIR    pH, Ven 7.329 (H) 7.250 - 7.300   pCO2, Ven 40.1 (L) 45.0 - 50.0 mmHg   pO2, Ven 85.7 (H) 30.0 - 45.0 mmHg    Bicarbonate 20.5 20.0 - 24.0 mEq/L   TCO2 18.1 0 - 100 mmol/L   Acid-base deficit 4.7 (H) 0.0 - 2.0 mmol/L   O2 Saturation 95.7 %   Patient temperature 98.6    Collection site VEIN    Drawn by COLLECTED BY LABORATORY    Sample type VEIN   Lipase, blood     Status: Abnormal   Collection Time: 11/15/14  8:38 PM  Result Value Ref Range   Lipase 67 (H) 11 - 59 U/L  CK     Status: Abnormal   Collection Time: 11/15/14  8:38 PM  Result Value Ref Range   Total CK 239 (H) 7 - 232 U/L  I-stat troponin, ED     Status: None   Collection Time: 11/15/14  8:39 PM  Result Value Ref Range   Troponin i, poc 0.00 0.00 - 0.08 ng/mL   Comment 3          I-stat chem 8, ed     Status: Abnormal   Collection Time: 11/15/14  8:47 PM  Result Value Ref Range   Sodium 127 (L) 135 - 145 mmol/L   Potassium 5.2 (H) 3.5 - 5.1 mmol/L   Chloride 92 (L) 96 - 112 mmol/L   BUN 18 6 - 23 mg/dL   Creatinine, Ser 8.65 (H) 0.50 - 1.35 mg/dL   Glucose, Bld >784 (HH) 70 - 99 mg/dL   Calcium, Ion 6.96 2.95 - 1.23 mmol/L   TCO2 20 0 - 100 mmol/L  Hemoglobin 16.3 13.0 - 17.0 g/dL   HCT 40.9 81.1 - 91.4 %   Comment NOTIFIED PHYSICIAN   CBG monitoring, ED     Status: Abnormal   Collection Time: 11/15/14 10:38 PM  Result Value Ref Range   Glucose-Capillary >600 (HH) 70 - 99 mg/dL   Comment 1 Document in Chart     UA glucose >1000  No results found for: HGBA1C  Estimated Creatinine Clearance: 90.3 mL/min (by C-G formula based on Cr of 1.7).  BNP (last 3 results) No results for input(s): PROBNP in the last 8760 hours.  Other results:  I have pearsonaly reviewed this: ECG REPORT  Rate: 98  Rhythm: SR ST&T Change: early repolarization   Filed Weights   11/15/14 2007  Weight: 131.543 kg (290 lb)     Cultures:    Component Value Date/Time   SDES THROAT 03/14/2014 0948   SPECREQUEST NONE 03/14/2014 0948   CULT  03/14/2014 0948    No Beta Hemolytic Streptococci Isolated Performed at [facility]   REPTSTATUS 03/16/2014 FINAL 03/14/2014 0948     Radiological Exams on Admission: Dg Chest 2 View  11/15/2014   CLINICAL DATA:  Chest pain, shortness of breath and cough for 2 weeks.  EXAM: CHEST  2 VIEW  COMPARISON:  PA and lateral chest 08/21/2014  FINDINGS: The lungs are clear. Heart size is normal. No pneumothorax or pleural effusion.  IMPRESSION: No acute disease.   Electronically Signed   By: Drusilla Kanner M.D.   On: 11/15/2014 20:57    Chart has been reviewed  Assessment/Plan  29 year old gentleman with no known prior history presents with elevated blood sugar and blood pressure was found to be newly diabetic likely with hypertension  Present on Admission:  . DM (diabetes mellitus) type II uncontrolled with eye manifestation - will initiate glucose stabilizer. Administer IV fluids diabetes education. We'll need to discharge patient on home regimen may need insulin initially given severity of diabetes  . Dehydration - administer IV fluids  . ARF (acute renal failure) in the setting of newly diagnosed diabetes and hyperglycemia. She'll follow creatinine.  . Elevated BP - patient likely has untreated hypertension. He would benefit from ACE inhibitor. Given currently elevated potassium and acute renal failure will hold off on initiating ACE inhibitor right now. Could be done at the later time when electrolytes improve   bronchitis symptomatic management no evidence of pneumonia. Recommended tobacco cessation. Continue albuterol as needed  Prophylaxis:   Lovenox, Protonix  CODE STATUS:  FULL CODE    Other plan as per orders.  I have spent a total of 55 min on this admission  Alianah Lofton 11/15/2014, 10:46 PM  [facility]  Pager [phone]   after 2 AM please page floor coverage PA If 7AM-7PM, please contact the day team taking care of the patient  [email or web address]  Password [email or web address]

## 2014-11-15 NOTE — ED Notes (Signed)
Hospitalist at bedside 

## 2014-11-15 NOTE — ED Notes (Signed)
Pt states that he has had cough and congestion for 3 weeks; pt states that he now has a productive cough with green phlegm; pt c/o generalized body aches; pt states "My muscles are sore like I have been working out"; pt c/o N/V in the last few days, none in the last 2 days; pt c/o increased thirst despite drinking lots of fluid pt also c/o blurry vision; pt c/o increase urination and frequency

## 2014-11-15 NOTE — ED Notes (Signed)
Patient transported to X-ray 

## 2014-11-15 NOTE — ED Provider Notes (Signed)
CSN: [id number]     Arrival date & time 11/15/14  1944 History   First MD Initiated Contact with Patient 11/15/14 2022     Chief Complaint  Patient presents with  . Hyperglycemia  . Generalized Body Aches     (Consider location/radiation/quality/duration/timing/severity/associated sxs/prior Treatment) The history is provided by the patient.  CRESCENCIO JOZWIAK is a 29 y.o. male hx of bronchitis, gout, here presenting with cough, and generalized body aches, blurry vision. She has some general body aches and blurry vision for the last week or so. He also has been very thirsty and drinking a lot of fluids and urinating a lot. He has some productive cough with greenish sputum as well but no fevers. Had several episodes of vomiting but none today. He has significant family history of diabetes but no personal history of diabetes.    Past Medical History  Diagnosis Date  . Bronchitis   . Gout    History reviewed. No pertinent past surgical history. Family History  Problem Relation Age of Onset  . Hypertension Mother   . Diabetes Father    History  Substance Use Topics  . Smoking status: Current Every Day Smoker -- 0.50 packs/day    Types: Cigarettes  . Smokeless tobacco: Not on file  . Alcohol Use: Yes     Comment: occasional    Review of Systems  Respiratory: Positive for cough.   Gastrointestinal: Positive for nausea and vomiting.  Neurological:       Blurry vision  All other systems reviewed and are negative.     Allergies  Penicillins  Home Medications   Prior to Admission medications   Medication Sig Start Date End Date Taking? Authorizing Provider  albuterol (PROVENTIL HFA;VENTOLIN HFA) 108 (90 BASE) MCG/ACT inhaler Inhale 1-2 puffs into the lungs every 6 (six) hours as needed for wheezing or shortness of breath. 08/21/14  Yes Courtney Forcucci, PA-C  Chlorphen-Pseudoephed-APAP (THERAFLU FLU/COLD PO) Take 30 mLs by mouth daily as needed (cold symptoms).   Yes Historical  Provider, MD  GuaiFENesin (MUCINEX PO) Take by mouth.   Yes Historical Provider, MD  benzonatate (TESSALON) 100 MG capsule Take 1 capsule (100 mg total) by mouth every 8 (eight) hours. Patient not taking: Reported on 11/15/2014 08/21/14   Toni Amend Forcucci, PA-C  clindamycin (CLEOCIN) 300 MG capsule Take 1 capsule (300 mg total) by mouth 3 (three) times daily. Patient not taking: Reported on 11/15/2014 03/14/14   Graylon Good, PA-C  fluticasone [facility]) 50 MCG/ACT nasal spray Place 2 sprays into both nostrils daily. Patient not taking: Reported on 11/15/2014 08/21/14   Toni Amend Forcucci, PA-C  guaiFENesin-codeine 100-10 MG/5ML syrup Take 10 mLs by mouth every 4 (four) hours as needed for cough. Patient not taking: Reported on 11/15/2014 03/14/14   Graylon Good, PA-C  indomethacin (INDOCIN) 25 MG capsule Take 1 capsule (25 mg total) by mouth 3 (three) times daily as needed. Patient not taking: Reported on 11/15/2014 12/04/13   Marissa Sciacca, PA-C  Loratadine 10 MG CAPS Take 1 capsule (10 mg total) by mouth daily. Patient not taking: Reported on 11/15/2014 08/21/14   Terri Piedra, PA-C  predniSONE (DELTASONE) 10 MG tablet 4 tabs PO QD for 4 days; 3 tabs PO QD for 3 days; 2 tabs PO QD for 2 days; 1 tab PO QD for 1 day Patient not taking: Reported on 11/15/2014 03/14/14   Graylon Good, PA-C  traMADol (ULTRAM) 50 MG tablet Take 1 tablet (50 mg total) by mouth  every 6 (six) hours as needed. Patient not taking: Reported on 11/15/2014 12/04/13   Marissa Sciacca, PA-C   BP 163/87 mmHg  Pulse 109  Temp(Src) 97.5 F (36.4 C) (Oral)  Resp 20  Ht  (1.854 m)  Wt 290 lb (131.543 kg)  BMI 38.27 kg/m2  SpO2 98% Physical Exam  Constitutional: He is oriented to person, place, and time.  Dehydrated   HENT:  Head: Normocephalic.  MM dry   Eyes: Conjunctivae are normal. Pupils are equal, round, and reactive to light.  Neck: Normal range of motion. Neck supple.  Cardiovascular: Regular rhythm and normal  heart sounds.   Tachycardic   Pulmonary/Chest: Effort normal and breath sounds normal. No respiratory distress. He has no wheezes. He has no rales.  Abdominal: Soft. Bowel sounds are normal. He exhibits no distension. There is no tenderness. There is no rebound and no guarding.  Musculoskeletal: Normal range of motion. He exhibits no edema or tenderness.  Neurological: He is alert and oriented to person, place, and time. No cranial nerve deficit. Coordination normal.  Skin: Skin is warm and dry.  Psychiatric: He has a normal mood and affect. His behavior is normal. Judgment and thought content normal.  Nursing note and vitals reviewed.   ED Course  Procedures (including critical care time)  CRITICAL CARE Performed by: Silverio Lay, Brooklyn Alfredo   Total critical care time: 45 min   Critical care time was exclusive of separately billable procedures and treating other patients.  Critical care was necessary to treat or prevent imminent or life-threatening deterioration.  Critical care was time spent personally by me on the following activities: development of treatment plan with patient and/or surrogate as well as nursing, discussions with consultants, evaluation of patient's response to treatment, examination of patient, obtaining history from patient or surrogate, ordering and performing treatments and interventions, ordering and review of laboratory studies, ordering and review of radiographic studies, pulse oximetry and re-evaluation of patient's condition.   Labs Review Labs Reviewed  COMPREHENSIVE METABOLIC PANEL - Abnormal; Notable for the following:    Sodium 123 (*)    Chloride 89 (*)    Glucose, Bld 932 (*)    Creatinine, Ser 1.81 (*)    AST 61 (*)    ALT 85 (*)    GFR calc non Af Amer 49 (*)    GFR calc Af Amer 56 (*)    All other components within normal limits  URINALYSIS, ROUTINE W REFLEX MICROSCOPIC - Abnormal; Notable for the following:    Glucose, UA >1000 (*)    All other  components within normal limits  BLOOD GAS, VENOUS - Abnormal; Notable for the following:    pH, Ven 7.329 (*)    pCO2, Ven 40.1 (*)    pO2, Ven 85.7 (*)    Acid-base deficit 4.7 (*)    All other components within normal limits  LIPASE, BLOOD - Abnormal; Notable for the following:    Lipase 67 (*)    All other components within normal limits  CK - Abnormal; Notable for the following:    Total CK 239 (*)    All other components within normal limits  CBG MONITORING, ED - Abnormal; Notable for the following:    Glucose-Capillary >600 (*)    All other components within normal limits  I-STAT CHEM 8, ED - Abnormal; Notable for the following:    Sodium 127 (*)    Potassium 5.2 (*)    Chloride 92 (*)    Creatinine,  Ser 1.70 (*)    Glucose, Bld >700 (*)    All other components within normal limits  CBC  URINE MICROSCOPIC-ADD ON  Rosezena SensorI-STAT TROPOININ, ED    Imaging Review Dg Chest 2 View  11/15/2014   CLINICAL DATA:  Chest pain, shortness of breath and cough for 2 weeks.  EXAM: CHEST  2 VIEW  COMPARISON:  PA and lateral chest 08/21/2014  FINDINGS: The lungs are clear. Heart size is normal. No pneumothorax or pleural effusion.  IMPRESSION: No acute disease.   Electronically Signed   By: Drusilla Kannerhomas  Dalessio M.D.   On: 11/15/2014 20:57     EKG Interpretation   Date/Time:  Wednesday November 15 2014 20:26:56 EST Ventricular Rate:  98 PR Interval:  159 QRS Duration: 74 QT Interval:  323 QTC Calculation: 412 R Axis:   38 Text Interpretation:  Sinus rhythm Left atrial enlargement ST elev,  probable normal early repol pattern Since last tracing rate faster  Confirmed by Conrad Zajkowski  MD, Janaiah Vetrano ([id number]) on 11/15/2014 8:32:16 PM      MDM   Final diagnoses:  Cough    Lorre NickJerome A Exline is a 29 y.o. male here with blurry vision, generalized body aches, vomiting. Consider new onset diabetes. CBG > 600. Will get CBG, labs. Consider DKA vs hyperosmolar.  10:17 PM Glucose 930, nl AG. UA nl. CXR unremarkable.  Given 2 L NS bolus. Started on glucostabilizer for hyperglycemia. I don't think he has DKA right now. Will admit.      Richardean Canalavid H Jethro Radke, MD 11/15/14 2218

## 2014-11-16 LAB — TSH: TSH: 2.842 u[IU]/mL (ref 0.350–4.500)

## 2014-11-16 LAB — GLUCOSE, CAPILLARY
GLUCOSE-CAPILLARY: 230 mg/dL — AB (ref 70–99)
GLUCOSE-CAPILLARY: 175 mg/dL — AB (ref 70–99)
Glucose-Capillary: 219 mg/dL — ABNORMAL HIGH (ref 70–99)
Glucose-Capillary: 250 mg/dL — ABNORMAL HIGH (ref 70–99)
Glucose-Capillary: 251 mg/dL — ABNORMAL HIGH (ref 70–99)
Glucose-Capillary: 429 mg/dL — ABNORMAL HIGH (ref 70–99)
Glucose-Capillary: 169 mg/dL — ABNORMAL HIGH (ref 70–99)
Glucose-Capillary: 137 mg/dL — ABNORMAL HIGH (ref 70–99)
Glucose-Capillary: 167 mg/dL — ABNORMAL HIGH (ref 70–99)
Glucose-Capillary: 174 mg/dL — ABNORMAL HIGH (ref 70–99)
Glucose-Capillary: 212 mg/dL — ABNORMAL HIGH (ref 70–99)
Glucose-Capillary: 273 mg/dL — ABNORMAL HIGH (ref 70–99)
Glucose-Capillary: 279 mg/dL — ABNORMAL HIGH (ref 70–99)
Glucose-Capillary: 156 mg/dL — ABNORMAL HIGH (ref 70–99)
Glucose-Capillary: 164 mg/dL — ABNORMAL HIGH (ref 70–99)
Glucose-Capillary: 200 mg/dL — ABNORMAL HIGH (ref 70–99)
Glucose-Capillary: 267 mg/dL — ABNORMAL HIGH (ref 70–99)

## 2014-11-16 LAB — COMPREHENSIVE METABOLIC PANEL
ALK PHOS: 81 U/L (ref 39–117)
ALT: 86 U/L — AB (ref 0–53)
AST: 65 U/L — ABNORMAL HIGH (ref 0–37)
Albumin: 4.6 g/dL (ref 3.5–5.2)
Anion gap: 8 (ref 5–15)
BUN: 16 mg/dL (ref 6–23)
CO2: 28 mmol/L (ref 19–32)
Calcium: 9.5 mg/dL (ref 8.4–10.5)
Chloride: 101 mmol/L (ref 96–112)
Creatinine, Ser: 1.6 mg/dL — ABNORMAL HIGH (ref 0.50–1.35)
GFR, EST AFRICAN AMERICAN: 65 mL/min — AB (ref >90)
GFR, EST NON AFRICAN AMERICAN: 56 mL/min — AB (ref >90)
GLUCOSE: 258 mg/dL — AB (ref 70–99)
Potassium: 4.3 mmol/L (ref 3.5–5.1)
Sodium: 137 mmol/L (ref 135–145)
Total Bilirubin: 1.4 mg/dL — ABNORMAL HIGH (ref 0.3–1.2)
Total Protein: 7.7 g/dL (ref 6.0–8.3)

## 2014-11-16 LAB — CBC
HEMATOCRIT: 43.3 % (ref 39.0–52.0)
Hemoglobin: 15.1 g/dL (ref 13.0–17.0)
MCH: 29.3 pg (ref 26.0–34.0)
MCHC: 34.9 g/dL (ref 30.0–36.0)
MCV: 83.9 fL (ref 78.0–100.0)
Platelets: 265 10*3/uL (ref 150–400)
RBC: 5.16 MIL/uL (ref 4.22–5.81)
RDW: 13.4 % (ref 11.5–15.5)
WBC: 8.6 10*3/uL (ref 4.0–10.5)

## 2014-11-16 LAB — MAGNESIUM: Magnesium: 2.5 mg/dL (ref 1.5–2.5)

## 2014-11-16 LAB — PHOSPHORUS: Phosphorus: 4.2 mg/dL (ref 2.3–4.6)

## 2014-11-16 MED ORDER — INSULIN ASPART 100 UNIT/ML ~~LOC~~ SOLN
0.0000 [IU] | Freq: Three times a day (TID) | SUBCUTANEOUS | Status: DC
  Administered 2014-11-16: 3 [IU] via SUBCUTANEOUS
  Administered 2014-11-17: 5 [IU] via SUBCUTANEOUS

## 2014-11-16 MED ORDER — LIVING WELL WITH DIABETES BOOK
Freq: Once | Status: AC
  Administered 2014-11-16: 1
  Filled 2014-11-16: qty 1

## 2014-11-16 MED ORDER — BENZONATATE 100 MG PO CAPS
100.0000 mg | ORAL_CAPSULE | Freq: Three times a day (TID) | ORAL | Status: DC
  Administered 2014-11-16 – 2014-11-17 (×4): 100 mg via ORAL
  Filled 2014-11-16 (×7): qty 1

## 2014-11-16 MED ORDER — PNEUMOCOCCAL VAC POLYVALENT 25 MCG/0.5ML IJ INJ
0.5000 mL | INJECTION | INTRAMUSCULAR | Status: AC
  Administered 2014-11-17: 0.5 mL via INTRAMUSCULAR
  Filled 2014-11-16 (×3): qty 0.5

## 2014-11-16 MED ORDER — SODIUM CHLORIDE 0.9 % IV SOLN
INTRAVENOUS | Status: DC
  Administered 2014-11-16 – 2014-11-17 (×3): via INTRAVENOUS

## 2014-11-16 MED ORDER — ALBUTEROL SULFATE (2.5 MG/3ML) 0.083% IN NEBU: 2.5000 mg | INHALATION_SOLUTION | Freq: Four times a day (QID) | RESPIRATORY_TRACT | Status: DC | PRN

## 2014-11-16 MED ORDER — ONDANSETRON HCL 4 MG PO TABS: 4.0000 mg | ORAL_TABLET | Freq: Four times a day (QID) | ORAL | Status: DC | PRN

## 2014-11-16 MED ORDER — FLUTICASONE PROPIONATE 50 MCG/ACT NA SUSP
2.0000 | Freq: Every day | NASAL | Status: DC
  Administered 2014-11-16 – 2014-11-17 (×2): 2 via NASAL
  Filled 2014-11-16: qty 16

## 2014-11-16 MED ORDER — GUAIFENESIN ER 600 MG PO TB12
600.0000 mg | ORAL_TABLET | Freq: Two times a day (BID) | ORAL | Status: DC
  Administered 2014-11-16 – 2014-11-17 (×4): 600 mg via ORAL
  Filled 2014-11-16 (×6): qty 1

## 2014-11-16 MED ORDER — INSULIN ASPART 100 UNIT/ML ~~LOC~~ SOLN
0.0000 [IU] | Freq: Every day | SUBCUTANEOUS | Status: DC
  Administered 2014-11-16: 2 [IU] via SUBCUTANEOUS

## 2014-11-16 MED ORDER — GUAIFENESIN-CODEINE 100-10 MG/5ML PO SOLN: 5.0000 mL | Freq: Four times a day (QID) | ORAL | Status: DC | PRN

## 2014-11-16 MED ORDER — ENOXAPARIN SODIUM 40 MG/0.4ML ~~LOC~~ SOLN
40.0000 mg | SUBCUTANEOUS | Status: DC
  Administered 2014-11-16 – 2014-11-17 (×2): 40 mg via SUBCUTANEOUS
  Filled 2014-11-16 (×2): qty 0.4

## 2014-11-16 MED ORDER — INSULIN STARTER KIT- SYRINGES (ENGLISH)
1.0000 | Freq: Once | Status: AC
  Administered 2014-11-16: 1
  Filled 2014-11-16: qty 1

## 2014-11-16 MED ORDER — INFLUENZA VAC SPLIT QUAD 0.5 ML IM SUSY
0.5000 mL | PREFILLED_SYRINGE | INTRAMUSCULAR | Status: AC
  Administered 2014-11-17: 0.5 mL via INTRAMUSCULAR
  Filled 2014-11-16 (×3): qty 0.5

## 2014-11-16 MED ORDER — ACETAMINOPHEN 650 MG RE SUPP: 650.0000 mg | Freq: Four times a day (QID) | RECTAL | Status: DC | PRN

## 2014-11-16 MED ORDER — SODIUM CHLORIDE 0.9 % IV SOLN
INTRAVENOUS | Status: DC
  Administered 2014-11-16: 7.4 [IU]/h via INTRAVENOUS
  Filled 2014-11-16: qty 2.5

## 2014-11-16 MED ORDER — INSULIN REGULAR BOLUS VIA INFUSION
0.0000 [IU] | Freq: Three times a day (TID) | INTRAVENOUS | Status: DC
  Administered 2014-11-16: 3.4 [IU] via INTRAVENOUS
  Filled 2014-11-16: qty 10

## 2014-11-16 MED ORDER — ACETAMINOPHEN 325 MG PO TABS: 650.0000 mg | ORAL_TABLET | Freq: Four times a day (QID) | ORAL | Status: DC | PRN

## 2014-11-16 MED ORDER — SODIUM CHLORIDE 0.9 % IJ SOLN
3.0000 mL | Freq: Two times a day (BID) | INTRAMUSCULAR | Status: DC
  Administered 2014-11-16 (×2): 3 mL via INTRAVENOUS

## 2014-11-16 MED ORDER — ONDANSETRON HCL 4 MG/2ML IJ SOLN: 4.0000 mg | Freq: Four times a day (QID) | INTRAMUSCULAR | Status: DC | PRN

## 2014-11-16 MED ORDER — ALBUTEROL SULFATE HFA 108 (90 BASE) MCG/ACT IN AERS: 1.0000 | INHALATION_SPRAY | Freq: Four times a day (QID) | RESPIRATORY_TRACT | Status: DC | PRN

## 2014-11-16 MED ORDER — SODIUM CHLORIDE 0.9 % IV SOLN: INTRAVENOUS | Status: DC

## 2014-11-16 MED ORDER — DEXTROSE 50 % IV SOLN: 25.0000 mL | INTRAVENOUS | Status: DC | PRN

## 2014-11-16 MED ORDER — INSULIN ASPART PROT & ASPART (70-30 MIX) 100 UNIT/ML ~~LOC~~ SUSP
15.0000 [IU] | Freq: Two times a day (BID) | SUBCUTANEOUS | Status: DC
  Administered 2014-11-16: 15 [IU] via SUBCUTANEOUS
  Filled 2014-11-16: qty 10

## 2014-11-16 MED ORDER — LIVING WELL WITH DIABETES BOOK
Freq: Once | Status: AC
  Administered 2014-11-16: 10:00:00
  Filled 2014-11-16: qty 1

## 2014-11-16 MED ORDER — NICOTINE 21 MG/24HR TD PT24
21.0000 mg | MEDICATED_PATCH | Freq: Every day | TRANSDERMAL | Status: DC
  Administered 2014-11-16 – 2014-11-17 (×2): 21 mg via TRANSDERMAL
  Filled 2014-11-16 (×2): qty 1

## 2014-11-16 NOTE — Progress Notes (Signed)
Inpatient Diabetes Program Recommendations  AACE/ADA: New Consensus Statement on Inpatient Glycemic Control (2013)  Target Ranges:  Prepandial:   less than 140 mg/dL      Peak postprandial:   less than 180 mg/dL (1-2 hours)      Critically ill patients:  140 - 180 mg/dL     Results for Jimmy Jordan, Jimmy Jordan (MRN [id number]) as of 11/16/2014 10:01  Ref. Range 11/15/2014 20:28  Glucose Latest Range: 70-99 mg/dL 932 (HH)     Chief Complaint: Cough, Increased Urination, Blurry Vision   History: Gout (+Family History of DM)  Current DM Orders: IV insulin drip per [employer] with pt about new diagnosis.   Explained what an A1C is, basic pathophysiology of DM Type 2, basic home care, basic diabetes diet nutrition principles, importance of checking CBGs and maintaining good CBG control to prevent long-term and short-term complications.  Reviewed signs and symptoms of hyperglycemia and hypoglycemia and how to treat hypoglycemia and hyperglycemia at home.  Also reviewed blood sugar goals at home.    RNs to provide ongoing basic DM education at bedside with this patient.  Have ordered educational booklet, insulin starter kit, and DM videos.  Have also placed RD consult for DM diet education for this patient.  Also discussed DM diet information with patient.  Encouraged patient to avoid beverages with sugar (regular soda, sweet tea, lemonade, fruit juice) and to consume mostly water.  Discussed what foods contain carbohydrates and how carbohydrates affect the body's blood sugar levels.  Encouraged patient to be careful with her portion sizes (especially grains, starchy vegetables, and fruits).    Gave patient information on purchasing an inexpensive CBG meter and strips OTC at [facility].  Meter is $16 and a box of 50 strips is $9.     MD- Patient does NOT have insurance and will need affordable medications at time of d/c.  Patient can purchase Reli-on brand 70/30 insulin at [facility]  for $25 per vial with a physician's Rx.  Please consider starting patient on 70/30 insulin bidwc and Metformin.  Orders Numbers when patient ready for d/c are as follows:  1. Reli-on 70/30 insulin [Order [id number]]  2. Metformin 500 mg tablets [Order #[id number]]  3. Insulin Syringes U-100 0.5 ml [Order # [id number]]    Will follow Wyn Quaker RN, MSN, CDE Diabetes Coordinator Inpatient Diabetes Program Team Pager: [phone] (8a-10p)

## 2014-11-16 NOTE — Progress Notes (Signed)
PROGRESS NOTE  Lorre NickJerome A Denes ZOX:[id number] DOB: 05-Jul-1986 DOA: 11/15/2014 PCP: No PCP Per Patient  HPI/Subjective: Jimmy Jordan is a 29 year old male with past medical history of gout who presented to the [facility]-ED with cough, generalized body aches, and blurry vision. Patient reported polydipsia, polyuria, fatigue/ weakness, and several episodes of emesis in the months prior to admission.   In the ED, was found to be severely hyperglycemic with a glucose of 932. Creatinine was 1.81. Patient was hyponatremic with a sodium of 123. Chloride was low at 89. Anion gap was 12. Patient was slightly alkalotic with a venous pH of 7.329 and a pCO2 of 40.1. CXR showed no acute findings.   Today, Jimmy Jordan is feeling better. Reports that his vision is still blurry but much improved since yesterday. Denies cough, shortness of breath, chest pain, nausea, vomiting, diarrhea, and abdominal pain. Patient's blood glucose has come down to 137-273 this morning in response to 11U/hr insulin infusion. Patient has responded to IVF and has a sodium of 137 and creatinine has decreased to 1.60. His anion gap is 8. TSH is normal at 2.842. Patient will be seen by diabetic educator and social work to arrange follow and medication once outpatient due to insurance status.   Assessment/Plan:   DM (diabetes mellitus) type II uncontrolled with eye manifestation -Patient presented with glucose of 932 and blurred vision/fatigue/polydipsia/polyuria  -Started on IVF and insulin infusion -This morning glucose has ranged from 137-273 on 11U/hr -Continue IVF and insulin until stabilized and will transition to subcutaneous injection of 70/30 insulin -Consult with diabetes education and social work to arrange follow up/ outpatient medication     Dehydration -Secondary to hyperglycemia -Patient presented with hyponatremia and a sodium of 123 -Responded to IVF- today sodium has normalized at 137 -Continue IVF and monitor with daily  BMP    ARF (acute renal failure) -Likely secondary to dehydration and hyperglycemia  -Patient presented with a creatinine of 1.81 -Has responded to fluid and today is 1.60 -Continue IVF and monitor with daily BMP    Elevated BP -Patient presented with BP of 163/87 -Patient was started on medication due to electrolyte imbalance  - Has since stabilized with last reading of 110/58 -Continue to monitor     Hyperglycemia due to type 2 diabetes mellitus -Patient presented with glucose of 932 and blurred vision/fatigue/polydipsia/polyuria  -Started on IVF and insulin infusion -This morning glucose has ranged from 137-273 on 11U/hr -Continue IVF and insulin until stabilized and will transition to subcutaneous injection of 70/30 insulin -Consult with diabetes education and social work to arrange follow up/ outpatient medication   DVT Prophylaxis:  Lovenox  Code Status: Full Family Communication: No family at bedside Disposition Plan: Inpatient   Consultants:  Social work  Diabetes education  Procedures:  None  Antibiotics:  None  Objective: Filed Vitals:   11/15/14 2246 11/15/14 2300 11/16/14 0002 11/16/14 0541  BP: 125/75 136/82 133/88 110/58  Pulse: 102  98 85  Temp:   98 F (36.7 C) 98.1 F (36.7 C)  TempSrc:   Oral Oral  Resp: 21 26 20 16   Height:   6\' 1"  (1.854 m)   Weight:   121.9 kg (268 lb 11.9 oz)   SpO2: 97%  97% 98%    Intake/Output Summary (Last 24 hours) at 11/16/14 1014 Last data filed at 11/16/14 0430  Gross per 24 hour  Intake      0 ml  Output  3 ml  Net     -3 ml   Filed Weights   11/15/14 2007 11/16/14 0002  Weight: 131.543 kg (290 lb) 121.9 kg (268 lb 11.9 oz)    Exam: General: Well developed, well nourished, NAD, appears stated age  HEENT:  PERR, Anicteic Sclera, MMM. No pharyngeal erythema or exudates  Neck: Supple, no JVD, no masses  Cardiovascular: RRR, S1 S2 auscultated, no rubs, murmurs or gallops.   Respiratory: Clear to  auscultation bilaterally with equal chest rise  Abdomen: Soft, nontender, nondistended, + bowel sounds  Extremities: warm dry without cyanosis clubbing or edema.  Neuro: AAOx3, cranial nerves grossly intact. Strength 5/5 in upper and lower extremities  Skin: Without rashes exudates or nodules.   Psych: Normal affect and demeanor with intact judgement and insight. Patient speaking in full sentences with appropriate speech.    Data Reviewed: Basic Metabolic Panel:  Recent Labs Lab 11/15/14 2028 11/15/14 2047 11/16/14 0135  NA 123* 127* 137  K 5.1 5.2* 4.3  CL 89* 92* 101  CO2 22  --  28  GLUCOSE 932* >700* 258*  BUN CREATININE 1.81* 1.70* 1.60*  CALCIUM 9.3  --  9.5  MG  --   --  2.5  PHOS  --   --  4.2   Liver Function Tests:  Recent Labs Lab 11/15/14 2028 11/16/14 0135  AST 61* 65*  ALT 85* 86*  ALKPHOS 96 81  BILITOT 0.8 1.4*  PROT 7.9 7.7  ALBUMIN 4.6 4.6    Recent Labs Lab 11/15/14 2038  LIPASE 67*   CBC:  Recent Labs Lab 11/15/14 2028 11/15/14 2047 11/16/14 0135  WBC 7.2  --  8.6  HGB 14.9 16.3 15.1  HCT 42.9 48.0 43.3  MCV 85.1  --  83.9  PLT 267  --  265   Cardiac Enzymes:  Recent Labs Lab 11/15/14 2038  CKTOTAL 239*    CBG:  Recent Labs Lab 11/16/14 0633 11/16/14 0740 11/16/14 0924 11/16/14 1036 11/16/14 1124  GLUCAP 137* 174* 212* 279* 273*    Studies: Dg Chest 2 View  11/15/2014   CLINICAL DATA:  Chest pain, shortness of breath and cough for 2 weeks.  EXAM: CHEST  2 VIEW  COMPARISON:  PA and lateral chest 08/21/2014  FINDINGS: The lungs are clear. Heart size is normal. No pneumothorax or pleural effusion.  IMPRESSION: No acute disease.   Electronically Signed   By: Drusilla Kanner M.D.   On: 11/15/2014 20:57    Scheduled Meds: . benzonatate  100 mg Oral Q8H  . enoxaparin (LOVENOX) injection  40 mg Subcutaneous Q24H  . fluticasone  2 spray Each Nare Daily  . guaiFENesin  600 mg Oral BID  . [START ON 11/17/2014]  Influenza vac split quadrivalent PF  0.5 mL Intramuscular Tomorrow-1000  . insulin regular  0-10 Units Intravenous TID WC  . living well with diabetes book   Does not apply Once  . nicotine  21 mg Transdermal Daily  . [START ON 11/17/2014] pneumococcal 23 valent vaccine  0.5 mL Intramuscular Tomorrow-1000  . sodium chloride  3 mL Intravenous Q12H   Continuous Infusions: . sodium chloride 125 mL/hr at 11/16/14 0044  . insulin (NOVOLIN-R) infusion 3.5 Units/hr (11/16/14 0820)    Active Problems:   DM (diabetes mellitus) type II uncontrolled with eye manifestation   Dehydration   ARF (acute renal failure)   Elevated BP   DM hyperosmolar coma, type 2   Hyperglycemia due  to type 2 diabetes mellitus    Raspect, Erin, PA-S [facility] 11/16/2014, 10:14 AM   Addendum  I personally evaluated patient on 11/16/2014 and agree with the above findings. Patient is a pleasant 29 year old gentleman with no significant past medical history who presented to the emergency department with complaints of polyuria, polydipsia, joint weakness, found have a blood sugar of 932. He was unaware of having diagnosis of diabetes mellitus. Lab work revealed a bicarbonate of 22 with anion gap of 12, as this likely reflected hyperosmolar hyperglycemic state. He was hydrated with IV fluids and started on an insulin drip. Today he appears to be doing better, tolerating by mouth intake. Will transition him to insulin 70/30 . IV insulin. Diabetic educator was consulted.

## 2014-11-16 NOTE — Progress Notes (Signed)
CARE MANAGEMENT NOTE 11/16/2014  Patient:  Jimmy Jordan,Jimmy Jordan   Account Number:  [id number]  Date Initiated:  11/16/2014  Documentation initiated by:  Ferdinand CavaSCHETTINO,Yulia Ulrich  Subjective/Objective Assessment:   29 yo male admitted with DM type 2 uncontrolled     Action/Plan:   discharge planning   Anticipated DC Date:  11/17/2014   Anticipated DC Plan:  HOME/SELF CARE      DC Planning Services  CM consult  Follow-up appt scheduled      Choice offered to / List presented to:             Status of service:  Completed, signed off Medicare Important Message given?   (If response is "NO", the following Medicare IM given date fields will be blank) Date Medicare IM given:   Medicare IM given by:   Date Additional Medicare IM given:   Additional Medicare IM given by:    Discharge Disposition:  HOME/SELF CARE  Per UR Regulation:    If discussed at Long Length of Stay Meetings, dates discussed:    Comments:  11/16/14 Ferdinand CavaAndrea Schettino RN [facility] CM [phone] Patient confirmed that he is uninsured and has no PCP. [facility] and patient agreeable to have appointment scheduled. [facility] and [facility] provided to patient. Patient verbalized understanding and had no questions or concerns.

## 2014-11-16 NOTE — Progress Notes (Signed)
Inpatient Diabetes Program Recommendations  AACE/ADA: New Consensus Statement on Inpatient Glycemic Control (2013)  Target Ranges:  Prepandial:   less than 140 mg/dL      Peak postprandial:   less than 180 mg/dL (1-2 hours)      Critically ill patients:  140 - 180 mg/dL     Results for Jimmy Jordan, Jimmy A (MRN [id number]) as of 11/16/2014 10:01  Ref. Range 11/15/2014 20:28  Glucose Latest Range: 70-99 mg/dL 409932 (HH)     Chief Complaint: Cough, Increased Urination, Blurry Vision   History: Gout (+Family History of DM)  Current DM Orders: IV insulin drip per [employer]    **Note patient admitted with glucose 932 mg/dl, new diagnosis of DM.  **RD consult ordered for DM diet education.  **Placed care management consult for patient- No insurance, No PCP.  **Have ordered DM educational materials for pt and have asked bedside RNs to begin DM survival skills education with patient.  DM Coordinator to see pt today as well.     Will follow [name] RN, MSN, CDE Diabetes Coordinator Inpatient Diabetes Program Team Pager: [phone] (8a-10p)

## 2014-11-17 DIAGNOSIS — N178 Other acute kidney failure: Secondary | ICD-10-CM

## 2014-11-17 DIAGNOSIS — E870 Hyperosmolality and hypernatremia: Secondary | ICD-10-CM

## 2014-11-17 LAB — GLUCOSE, CAPILLARY
Glucose-Capillary: 247 mg/dL — ABNORMAL HIGH (ref 70–99)
Glucose-Capillary: 275 mg/dL — ABNORMAL HIGH (ref 70–99)

## 2014-11-17 LAB — BASIC METABOLIC PANEL
Anion gap: 9 (ref 5–15)
BUN: 10 mg/dL (ref 6–23)
CO2: 24 mmol/L (ref 19–32)
Calcium: 8.9 mg/dL (ref 8.4–10.5)
Chloride: 104 mmol/L (ref 96–112)
Creatinine, Ser: 1.4 mg/dL — ABNORMAL HIGH (ref 0.50–1.35)
GFR calc Af Amer: 77 mL/min — ABNORMAL LOW (ref >90)
GFR calc non Af Amer: 66 mL/min — ABNORMAL LOW (ref >90)
Glucose, Bld: 244 mg/dL — ABNORMAL HIGH (ref 70–99)
Potassium: 4.3 mmol/L (ref 3.5–5.1)
Sodium: 137 mmol/L (ref 135–145)

## 2014-11-17 MED ORDER — INSULIN ASPART PROT & ASPART (70-30 MIX) 100 UNIT/ML ~~LOC~~ SUSP
20.0000 [IU] | Freq: Two times a day (BID) | SUBCUTANEOUS | Status: DC
Start: 2014-11-17 — End: 2014-11-17
  Administered 2014-11-17: 20 [IU] via SUBCUTANEOUS
  Filled 2014-11-17: qty 10

## 2014-11-17 MED ORDER — FREESTYLE SYSTEM KIT: 1.0000 | PACK | Status: AC | PRN

## 2014-11-17 MED ORDER — INSULIN ASPART PROT & ASPART (70-30 MIX) 100 UNIT/ML ~~LOC~~ SUSP: 20.0000 [IU] | Freq: Two times a day (BID) | SUBCUTANEOUS | Status: DC

## 2014-11-17 NOTE — Discharge Summary (Signed)
Physician Discharge Summary  Jimmy Jordan QRF:[id number] DOB: 1986-07-22 DOA: 11/15/2014  PCP: No PCP Per Patient   Admit date: 11/15/2014 Discharge date: 11/17/2014  Time spent: 45 minutes  Recommendations for Outpatient Follow-up:  1. Follow up on blood sugars, he was instructed to check BS q AC and record in log book. He was discharged on Insulin 70/30 20 units [place] BID 2. Monitor BP outpatient  3. Repeat BMP within one week   Discharge Diagnoses:  Active Problems:   DM (diabetes mellitus) type II uncontrolled with eye manifestation   Dehydration   ARF (acute renal failure)   Elevated BP   DM hyperosmolar coma, type 2   Hyperglycemia due to type 2 diabetes mellitus   Discharge Condition: Stable  History of present illness:  Jimmy Jordan is a 29 year old male with past medical history of gout who presented to the [facility] with cough, generalized body aches, and blurry vision. Patient reported polydipsia, polyuria, fatigue/ weakness, and several episodes of emesis in the months prior to admission.   In the ED, was found to be severely hyperglycemic with a glucose of 932. Creatinine was 1.81. Patient was hyponatremic with a sodium of 123. Chloride was low at 89. Anion gap was 12. Patient was slightly alkalotic with a venous pH of 7.329 and a pCO2 of 40.1. CXR showed no acute findings.   Upon discharge Jimmy Jordan is feeling considerably better. His blurred vision, polyuria, polydipsia, and generalized weakness/ fatigue has resolved and he is currently symptom free. At the time of discharge sodium was normal at 137, creatinine had improved to 1.4, and anion gap was 9. On the day of discharge glucose ranged from 156-273. Patient met with diabetes educator yesterday and was given insulin starter kit and will be discharged with insulin in hand. States he understands the importance of medication compliance/ regular blood sugar monitoring, knows the symptoms of hypoglycemia, and understands how to  use insulin. Social work was able to make him a follow up appointment [facility] on Monday 11/20/14 at 0900 and help with medication costs.   Hospital Course:  DM (diabetes mellitus) type II uncontrolled with eye manifestation -Patient presented with glucose of 932 and blurred vision/fatigue/polydipsia/polyuria  -Started on IVF and insulin infusion  -Transitioned to subcutaneous injection of 70/30 insulin -On day of discharge CBG ranged from 156-273 -Patient met with diabetes education and was given insulin starter kit  -Social work to arranged follow up at [facility] clinic on 11/20/14 at 0900    Dehydration -Secondary to hyperglycemia -Patient presented with hyponatremia and a sodium of 123 -Resolved with IVF- upon discharge sodium had normalized to 137   ARF (acute renal failure) -Likely secondary to dehydration and hyperglycemia  -Patient presented with a creatinine of 1.81 -Has responded to fluid and at time of discharge his creatinine is 1.40 -Repeat BMP on hospital follow up   Elevated BP -Patient presented with BP of 163/87 - Stabilized during hospitalization -His SBPs remained in the 120-130 range for the most part, follow up on his BP's on hospital follow up visit.    Hyperglycemia due to type 2 diabetes mellitus -Patient presented with glucose of 932 and blurred vision/fatigue/polydipsia/polyuria  -Started on IVF and insulin infusion -Transitioned to subcutaneous injection of 70/30 insulin -On day of discharge CBG ranged from 156-273 -Patient met with diabetes education and was given insulin starter kit  -Social work to arranged follow up at [facility] clinic on 11/20/14 0900  Procedures:  None  Consultations:  Diabetes education  Social work  Discharge Exam: Filed Vitals:   11/16/14 1452 11/16/14 2036 11/17/14 0014 11/17/14 0413  BP: 127/77 146/85 119/60 124/54  Pulse: 94 92 75 66  Temp: 98.3 F (36.8 C) 97.7 F (36.5 C)  97.9 F (36.6 C) 98 F (36.7 C)  TempSrc: Oral Oral Oral Oral  Resp: _0 Height:      Weight:      SpO2: 97% 98% 98% 98%   Filed Weights   11/15/14 2007 11/16/14 0002  Weight: 131.543 kg (290 lb) 121.9 kg (268 lb 11.9 oz)     General: Well developed, overweight, NAD, appears stated age  Cardiovascular: RRR, S1 and S2, no murmur, gallop, or rubs Respiratory: Clear to auscultation bilaterally, no rales, rhonchi, or wheezes Abdominal: Soft, nontender, nondistended, + bowel sounds  Extremities:Warm and dry. 5/5 strength. No evidence of cyanosis, clubbing, or edema.  Discharge Instructions  Diet recommendation: Carb modified    Current Discharge Medication List    START taking these medications   Details  glucose monitoring kit (FREESTYLE) monitoring kit 1 each by Does not apply route as needed for other. Qty: 1 each, Refills: 0    insulin aspart protamine- aspart (NOVOLOG MIX 70/30) (70-30) 100 UNIT/ML injection Inject 0.2 mLs (20 Units total) into the skin 2 (two) times daily with a meal. Qty: 10 mL, Refills: 11      CONTINUE these medications which have NOT CHANGED   Details  albuterol (PROVENTIL HFA;VENTOLIN HFA) 108 (90 BASE) MCG/ACT inhaler Inhale 1-2 puffs into the lungs every 6 (six) hours as needed for wheezing or shortness of breath. Qty: 1 Inhaler, Refills: 0    benzonatate (TESSALON) 100 MG capsule Take 1 capsule (100 mg total) by mouth every 8 (eight) hours. Qty: 21 capsule, Refills: 0    fluticasone (FLONASE) 50 MCG/ACT nasal spray Place 2 sprays into both nostrils daily. Qty: 16 g, Refills: 0      STOP taking these medications     Chlorphen-Pseudoephed-APAP (THERAFLU FLU/COLD PO)      GuaiFENesin (MUCINEX PO)      clindamycin (CLEOCIN) 300 MG capsule      guaiFENesin-codeine 100-10 MG/5ML syrup      indomethacin (INDOCIN) 25 MG capsule      Loratadine 10 MG CAPS      predniSONE (DELTASONE) 10 MG tablet      traMADol (ULTRAM) 50 MG  tablet        Allergies  Allergen Reactions  . Penicillins Other (See Comments)    childhood   Follow-up Information    Follow up with [facility] On 11/20/2014.   Why:  appointment 9:00 bring discharge paper work and ID   Contact information:   [address] [place] [address] [phone]       The results of significant diagnostics from this hospitalization (including imaging, microbiology, ancillary and laboratory) are listed below for reference.    Significant Diagnostic Studies: Dg Chest 2 View  11/15/2014   CLINICAL DATA:  Chest pain, shortness of breath and cough for 2 weeks.  EXAM: CHEST  2 VIEW  COMPARISON:  PA and lateral chest 08/21/2014  FINDINGS: The lungs are clear. Heart size is normal. No pneumothorax or pleural effusion.  IMPRESSION: No acute disease.   Electronically Signed   By: Inge Rise M.D.   On: 11/15/2014 20:57    Microbiology: No results found for this  or any previous visit (from the past 240 hour(s)).   Labs: Basic Metabolic Panel:  Recent Labs Lab 11/15/14 2028 11/15/14 2047 11/16/14 0135 11/17/14 0532  NA 123* 127* 137 137  K 5.1 5.2* 4.3 4.3  CL 89* 92* 101 104  CO2 22  --  28 24  GLUCOSE 932* >700* 258* 244*  BUN _0 CREATININE 1.81* 1.70* 1.60* 1.40*  CALCIUM 9.3  --  9.5 8.9  MG  --   --  2.5  --   PHOS  --   --  4.2  --    Liver Function Tests:  Recent Labs Lab 11/15/14 2028 11/16/14 0135  AST 61* 65*  ALT 85* 86*  ALKPHOS 96 81  BILITOT 0.8 1.4*  PROT 7.9 7.7  ALBUMIN 4.6 4.6    Recent Labs Lab 11/15/14 2038  LIPASE 67*   CBC:  Recent Labs Lab 11/15/14 2028 11/15/14 2047 11/16/14 0135  WBC 7.2  --  8.6  HGB 14.9 16.3 15.1  HCT 42.9 48.0 43.3  MCV 85.1  --  83.9  PLT 267  --  265   Cardiac Enzymes:  Recent Labs Lab 11/15/14 2038  CKTOTAL 239*   CBG:  Recent Labs Lab 11/16/14 1447 11/16/14 1623 11/16/14 1736 11/16/14 2217  11/17/14 0730  GLUCAP 156* 164* 200* 267* 247*   Signed:  Raspect, Erin, PA-S [facility] 11/17/2014, 9:53 AM

## 2014-11-17 NOTE — Progress Notes (Signed)
CARE MANAGEMENT NOTE 11/17/2014  Patient:  Jimmy Jordan, Jimmy Jordan   Account Number:  [id number]  Date Initiated:  11/16/2014  Documentation initiated by:  Edwyna Shell  Subjective/Objective Assessment:   29 yo male admitted with DM type 2 uncontrolled     Action/Plan:   discharge planning   Anticipated DC Date:  11/17/2014   Anticipated DC Plan:  [place]  CM consult  Follow-up appt scheduled      Choice offered to / List presented to:             Status of service:  Completed, signed off Medicare Important Message given?   (If response is "NO", the following Medicare IM given date fields will be blank) Date Medicare IM given:   Medicare IM given by:   Date Additional Medicare IM given:   Additional Medicare IM given by:    Discharge Disposition:  HOME/SELF CARE  Per UR Regulation:    If discussed at Long Length of Stay Meetings, dates discussed:    Comments:  11/17/14 Edwyna Shell RN BSN CM [phone] Patient provided Diabetes starter kit from diabetes educator. Will receive prescription for Glucometer, test strips and lacets and patient has been informed that he is to take the prescription to the [facility] upon discharge for affordability of equipment. Patient verbalized understanding and had no questions or concerns.  11/16/14 Edwyna Shell RN BSn CM [phone] Patient confirmed that he is uninsured and has no PCP. Discussed the [facility] and patient agreeable to have appointment scheduled. Appointment scheduled for March 7th at 9:00am at [facility] and pamphlet for [facility] provided to patient. Patient verbalized understanding and had no questions or concerns.

## 2014-11-17 NOTE — Progress Notes (Signed)
Results for JOVEN, MOM (MRN [id number]) as of 11/17/2014 10:47  Ref. Range 11/16/2014 16:23 11/16/2014 17:36 11/16/2014 22:17 11/17/2014 05:32 11/17/2014 07:30  Glucose-Capillary Latest Range: 70-99 mg/dL 164 (H) 200 (H) 267 (H)  247 (H)    Results for SULEIMAN, FINIGAN (MRN [id number]) as of 11/17/2014 10:47  Ref. Range 11/17/2014 05:32  Glucose   Latest Range: 70-99 mg/dL 244 (H)   Met with patient to provide follow-up diabetes management education.  Pt able to successfully demonstrate how to check CBG and use of insulin syringe.  Normal values were discussed with emphasis on hypoglycemia and proper treatment.  Written guidelines were provided for hypoglycemia.  Discussed times for administration of 70/30, need to eat, and to not skip meals, even after working night shift.  Sick day rules discussed including need to stay hydrated, consumption of carbohydrate containing liquids if unable to tolerate solid foods, and possibility of need to decrease insulin if unable to take anything by mouth.  Pt stated he has looked through diabetes education book but did not have specific questions at this time.  No family present for teaching today.  Encouraged pt to follow up with [facility] appt on 3/7.  Plan for pt to be discharged home today.  Barnabas Lister, RN, Administrator MSN Education Student  Wyn Quaker RN, MSN, CDE Diabetes Coordinator Inpatient Diabetes Program Team Pager: [phone] (8a-10p)

## 2014-11-17 NOTE — Progress Notes (Signed)
Witnessed patient draw up insulin and give himself insulin injection. He did not experience any difficulty with this procedure.

## 2014-11-20 ENCOUNTER — Ambulatory Visit: Payer: Self-pay | Attending: Family Medicine | Admitting: Family Medicine

## 2014-11-20 VITALS — BP 140/90 | HR 77 | Temp 97.4°F | Resp 16 | Ht 74.0 in | Wt 292.0 lb

## 2014-11-20 DIAGNOSIS — R739 Hyperglycemia, unspecified: Secondary | ICD-10-CM

## 2014-11-20 DIAGNOSIS — E1139 Type 2 diabetes mellitus with other diabetic ophthalmic complication: Secondary | ICD-10-CM

## 2014-11-20 DIAGNOSIS — IMO0002 Reserved for concepts with insufficient information to code with codable children: Secondary | ICD-10-CM

## 2014-11-20 DIAGNOSIS — E1165 Type 2 diabetes mellitus with hyperglycemia: Secondary | ICD-10-CM

## 2014-11-20 LAB — GLUCOSE, POCT (MANUAL RESULT ENTRY)
POC GLUCOSE: 217 mg/dL — AB (ref 70–99)
POC Glucose: 287 mg/dl — AB (ref 70–99)

## 2014-11-20 LAB — POCT GLYCOSYLATED HEMOGLOBIN (HGB A1C): HEMOGLOBIN A1C: 11.2

## 2014-11-20 MED ORDER — METFORMIN HCL 500 MG PO TABS: 500.0000 mg | ORAL_TABLET | Freq: Two times a day (BID) | ORAL | Status: DC

## 2014-11-20 MED ORDER — INSULIN ASPART 100 UNIT/ML ~~LOC~~ SOLN
10.0000 [IU] | Freq: Once | SUBCUTANEOUS | Status: AC
  Administered 2014-11-20: 10 [IU] via SUBCUTANEOUS

## 2014-11-20 NOTE — Assessment & Plan Note (Addendum)
Patient is alert oriented appropriate blood sugar today is 287. He is in no distress skin is warm and dry neck is supple full range of motion without adenopathy or tenderness. Lungs are clear to auscultation heart sounds are regular without murmur,. I note in his chart a creatinine of 1.4. Hids A1C is 11.2 today  Plan we will start him on metformin 500 mg considering his creatinine is not above the upper limits for contraindication that is getting pretty close. Have asked him to check his blood sugars twice a day and instructed him not to take insulin if blood sugars less than 150. Explained that we probably will want him to see a nutritionist at some point. Have also asked him to make an appointment with our financial representatives to see about financial assistance

## 2014-11-20 NOTE — Progress Notes (Signed)
Patient here after recent hospitilization on 3/2 for hyperglycemia.  Patient was admitted for 3 days and was told his initial sugar was 900.  He was released with insulin and a CBG meter and is here to establish care.  Patient reports he has been checking his sugars ac and they range from 205-317.

## 2014-11-20 NOTE — Patient Instructions (Addendum)
Appointment with primary provider in 2-3 weeks.Take metformin 500 mg twice a day with breakfast and dinner.Make appointment with our financial aid people here. I have given him a Diabetes and Food handout and diabetes and exercise   Diabetes and Exercise Exercising regularly is important. It is not just about losing weight. It has many health benefits, such as:  Improving your overall fitness, flexibility, and endurance.  Increasing your bone density.  Helping with weight control.  Decreasing your body fat.  Increasing your muscle strength.  Reducing stress and tension.  Improving your overall health. People with diabetes who exercise gain additional benefits because exercise:  Reduces appetite.  Improves the body's use of blood sugar (glucose).  Helps lower or control blood glucose.  Decreases blood pressure.  Helps control blood lipids (such as cholesterol and triglycerides).  Improves the body's use of the hormone insulin by:  Increasing the body's insulin sensitivity.  Reducing the body's insulin needs.  Decreases the risk for heart disease because exercising:  Lowers cholesterol and triglycerides levels.  Increases the levels of good cholesterol (such as high-density lipoproteins [HDL]) in the body.  Lowers blood glucose levels. YOUR ACTIVITY PLAN  Choose an activity that you enjoy and set realistic goals. Your health care provider or diabetes educator can help you make an activity plan that works for you. Exercise regularly as directed by your health care provider. This includes:  Performing resistance training twice a week such as push-ups, sit-ups, lifting weights, or using resistance bands.  Performing 150 minutes of cardio exercises each week such as walking, running, or playing sports.  Staying active and spending no more than 90 minutes at one time being inactive. Even short bursts of exercise are good for you. Three 10-minute sessions spread throughout  the day are just as beneficial as a single 30-minute session. Some exercise ideas include:  Taking the dog for a walk.  Taking the stairs instead of the elevator.  Dancing to your favorite song.  Doing an exercise video.  Doing your favorite exercise with a friend. RECOMMENDATIONS FOR EXERCISING WITH TYPE 1 OR TYPE 2 DIABETES   Check your blood glucose before exercising. If blood glucose levels are greater than 240 mg/dL, check for urine ketones. Do not exercise if ketones are present.  Avoid injecting insulin into areas of the body that are going to be exercised. For example, avoid injecting insulin into:  The arms when playing tennis.  The legs when jogging.  Keep a record of:  Food intake before and after you exercise.  Expected peak times of insulin action.  Blood glucose levels before and after you exercise.  The type and amount of exercise you have done.  Review your records with your health care provider. Your health care provider will help you to develop guidelines for adjusting food intake and insulin amounts before and after exercising.  If you take insulin or oral hypoglycemic agents, watch for signs and symptoms of hypoglycemia. They include:  Dizziness.  Shaking.  Sweating.  Chills.  Confusion.  Drink plenty of water while you exercise to prevent dehydration or heat stroke. Body water is lost during exercise and must be replaced.  Talk to your health care provider before starting an exercise program to make sure it is safe for you. Remember, almost any type of activity is better than none. Document Released: 11/22/2003 Document Revised: 01/16/2014 Document Reviewed: 02/08/2013 [facility] Patient Information 2015 [place], [place]. This information is not intended to replace advice given to you  by your health care provider. Make sure you discuss any questions you have with your health care provider.     Diabetes Mellitus and Food It is important for you  to manage your blood sugar (glucose) level. Your blood glucose level can be greatly affected by what you eat. Eating healthier foods in the appropriate amounts throughout the day at about the same time each day will help you control your blood glucose level. It can also help slow or prevent worsening of your diabetes mellitus. Healthy eating may even help you improve the level of your blood pressure and reach or maintain a healthy weight.  HOW CAN FOOD AFFECT ME? Carbohydrates Carbohydrates affect your blood glucose level more than any other type of food. Your dietitian will help you determine how many carbohydrates to eat at each meal and teach you how to count carbohydrates. Counting carbohydrates is important to keep your blood glucose at a healthy level, especially if you are using insulin or taking certain medicines for diabetes mellitus. Alcohol Alcohol can cause sudden decreases in blood glucose (hypoglycemia), especially if you use insulin or take certain medicines for diabetes mellitus. Hypoglycemia can be a life-threatening condition. Symptoms of hypoglycemia (sleepiness, dizziness, and disorientation) are similar to symptoms of having too much alcohol.  If your health care provider has given you approval to drink alcohol, do so in moderation and use the following guidelines:  Women should not have more than one drink per day, and men should not have more than two drinks per day. One drink is equal to:  12 oz of beer.  5 oz of wine.  1 oz of hard liquor.  Do not drink on an empty stomach.  Keep yourself hydrated. Have water, diet soda, or unsweetened iced tea.  Regular soda, juice, and other mixers might contain a lot of carbohydrates and should be counted. WHAT FOODS ARE NOT RECOMMENDED? As you make food choices, it is important to remember that all foods are not the same. Some foods have fewer nutrients per serving than other foods, even though they might have the same number of  calories or carbohydrates. It is difficult to get your body what it needs when you eat foods with fewer nutrients. Examples of foods that you should avoid that are high in calories and carbohydrates but low in nutrients include:  Trans fats (most processed foods list trans fats on the Nutrition Facts label).  Regular soda.  Juice.  Candy.  Sweets, such as cake, pie, doughnuts, and cookies.  Fried foods. WHAT FOODS CAN I EAT? Have nutrient-rich foods, which will nourish your body and keep you healthy. The food you should eat also will depend on several factors, including:  The calories you need.  The medicines you take.  Your weight.  Your blood glucose level.  Your blood pressure level.  Your cholesterol level. You also should eat a variety of foods, including:  Protein, such as meat, poultry, fish, tofu, nuts, and seeds (lean animal proteins are best).  Fruits.  Vegetables.  Dairy products, such as milk, cheese, and yogurt (low fat is best).  Breads, grains, pasta, cereal, rice, and beans.  Fats such as olive oil, trans fat-free margarine, canola oil, avocado, and olives. DOES EVERYONE WITH DIABETES MELLITUS HAVE THE SAME MEAL PLAN? Because every person with diabetes mellitus is different, there is not one meal plan that works for everyone. It is very important that you meet with a dietitian who will help you create a meal  plan that is just right for you. Document Released: 05/29/2005 Document Revised: 09/06/2013 Document Reviewed: 07/29/2013 [facility] Patient Information 2015 [place]. This information is not intended to replace advice given to you by your health care provider. Make sure you discuss any questions you have with your health care provider.

## 2014-12-04 ENCOUNTER — Ambulatory Visit: Payer: Self-pay | Admitting: Family Medicine

## 2014-12-11 ENCOUNTER — Ambulatory Visit: Payer: Self-pay | Admitting: Family Medicine

## 2014-12-23 ENCOUNTER — Emergency Department (HOSPITAL_COMMUNITY)
Admission: EM | Admit: 2014-12-23 | Discharge: 2014-12-23 | Disposition: A | Discharge status: Home / Self Care | Payer: Medicaid Other | Attending: Emergency Medicine | Admitting: Emergency Medicine

## 2014-12-23 ENCOUNTER — Encounter (HOSPITAL_COMMUNITY): Payer: Self-pay | Admitting: Emergency Medicine

## 2014-12-23 DIAGNOSIS — Z8709 Personal history of other diseases of the respiratory system: Secondary | ICD-10-CM | POA: Insufficient documentation

## 2014-12-23 DIAGNOSIS — M109 Gout, unspecified: Secondary | ICD-10-CM

## 2014-12-23 DIAGNOSIS — Z794 Long term (current) use of insulin: Secondary | ICD-10-CM | POA: Insufficient documentation

## 2014-12-23 DIAGNOSIS — Z720 Tobacco use: Secondary | ICD-10-CM | POA: Insufficient documentation

## 2014-12-23 DIAGNOSIS — Z7951 Long term (current) use of inhaled steroids: Secondary | ICD-10-CM | POA: Insufficient documentation

## 2014-12-23 DIAGNOSIS — Z79899 Other long term (current) drug therapy: Secondary | ICD-10-CM | POA: Insufficient documentation

## 2014-12-23 DIAGNOSIS — M10072 Idiopathic gout, left ankle and foot: Secondary | ICD-10-CM | POA: Insufficient documentation

## 2014-12-23 DIAGNOSIS — Z880 Allergy status to penicillin: Secondary | ICD-10-CM | POA: Insufficient documentation

## 2014-12-23 DIAGNOSIS — E119 Type 2 diabetes mellitus without complications: Secondary | ICD-10-CM | POA: Insufficient documentation

## 2014-12-23 HISTORY — DX: Type 2 diabetes mellitus without complications: E11.9

## 2014-12-23 LAB — I-STAT CHEM 8, ED
BUN: 14 mg/dL (ref 6–23)
Calcium, Ion: 1.18 mmol/L (ref 1.12–1.23)
Chloride: 101 mmol/L (ref 96–112)
Creatinine, Ser: 1.5 mg/dL — ABNORMAL HIGH (ref 0.50–1.35)
Glucose, Bld: 285 mg/dL — ABNORMAL HIGH (ref 70–99)
HEMATOCRIT: 49 % (ref 39.0–52.0)
HEMOGLOBIN: 16.7 g/dL (ref 13.0–17.0)
POTASSIUM: 4.8 mmol/L (ref 3.5–5.1)
SODIUM: 140 mmol/L (ref 135–145)
TCO2: 24 mmol/L (ref 0–100)

## 2014-12-23 LAB — CBG MONITORING, ED: GLUCOSE-CAPILLARY: 217 mg/dL — AB (ref 70–99)

## 2014-12-23 MED ORDER — HYDROCODONE-ACETAMINOPHEN 5-325 MG PO TABS: 2.0000 | ORAL_TABLET | ORAL | Status: DC | PRN

## 2014-12-23 MED ORDER — INDOMETHACIN 50 MG PO CAPS
50.0000 mg | ORAL_CAPSULE | Freq: Three times a day (TID) | ORAL | Status: DC | PRN
  Administered 2014-12-23: 50 mg via ORAL
  Filled 2014-12-23: qty 1

## 2014-12-23 MED ORDER — INDOMETHACIN 50 MG PO CAPS: 50.0000 mg | ORAL_CAPSULE | Freq: Three times a day (TID) | ORAL | Status: DC | PRN

## 2014-12-23 NOTE — ED Notes (Signed)
Patient c/o left great toe pain. Reports hx of gout, states this feels the same. Patient states he was taking indomethacin but does not have a PCP to write the script. Patient took 600mg  ibuprofen for pain x2 doses with minimal relief.

## 2014-12-23 NOTE — ED Provider Notes (Signed)
CSN: [id number]     Arrival date & time 12/23/14  0047 History   First MD Initiated Contact with Patient 12/23/14 0303     Chief Complaint  Patient presents with  . gout pain     hx of same     (Consider location/radiation/quality/duration/timing/severity/associated sxs/prior Treatment) HPI 29 year old male presents to the emergency department from home with complaint of left great toe pain.  Asian has history of gout.  He reports symptoms started 2 days ago.  He has taken ibuprofen without significant improvement.  He reports he has noticed some improvement since drinking cherry juice.  Patient wishes to get prescription for indomethacin as it is helped in the past.  Patient has history of diabetes.  He reports he is compliant with medication.  Patienthypertensive.  A, denies previous history of same.  [facility]. Past Medical History  Diagnosis Date  . Bronchitis   . Gout   . Gout   . Diabetes mellitus without complication    History reviewed. No pertinent past surgical history. Family History  Problem Relation Age of Onset  . Hypertension Mother   . Diabetes Father    History  Substance Use Topics  . Smoking status: Current Every Day Smoker -- 0.50 packs/day    Types: Cigarettes  . Smokeless tobacco: Not on file  . Alcohol Use: No     Comment: occasional    Review of Systems   See History of Present Illness; otherwise all other systems are reviewed and negative  Allergies  Penicillins  Home Medications   Prior to Admission medications   Medication Sig Start Date End Date Taking? Authorizing Provider  albuterol (PROVENTIL HFA;VENTOLIN HFA) 108 (90 BASE) MCG/ACT inhaler Inhale 1-2 puffs into the lungs every 6 (six) hours as needed for wheezing or shortness of breath. 08/21/14  Yes Courtney Forcucci, PA-C  fluticasone (FLONASE) 50 MCG/ACT nasal spray Place 2 sprays into both nostrils daily. 08/21/14  Yes Courtney Forcucci, PA-C   insulin aspart protamine- aspart (NOVOLOG MIX 70/30) (70-30) 100 UNIT/ML injection Inject 0.2 mLs (20 Units total) into the skin 2 (two) times daily with a meal. 11/17/14  Yes Kelvin Cellar, MD  metFORMIN (GLUCOPHAGE) 500 MG tablet Take 1 tablet (500 mg total) by mouth 2 (two) times daily with a meal. Patient taking differently: Take 500 mg by mouth 3 (three) times daily.  11/20/14  Yes Micheline Chapman, NP  glucose monitoring kit (FREESTYLE) monitoring kit 1 each by Does not apply route as needed for other. 11/17/14   Kelvin Cellar, MD   BP 175/112 mmHg  Pulse 105  Temp(Src) 97.8 F (36.6 C) (Oral)  Resp 20  SpO2 95% Physical Exam  Constitutional: He appears well-developed and well-nourished. No distress.  HENT:  Head: Normocephalic and atraumatic.  Nose: Nose normal.  Mouth/Throat: Oropharynx is clear and moist.  Neck: Normal range of motion. Neck supple. No JVD present. No tracheal deviation present. No thyromegaly present.  Cardiovascular: Normal rate, regular rhythm, normal heart sounds and intact distal pulses.  Exam reveals no gallop and no friction rub.   No murmur heard. Pulmonary/Chest: Effort normal and breath sounds normal. No stridor. No respiratory distress. He has no wheezes. He has no rales. He exhibits no tenderness.  Abdominal: Soft. Bowel sounds are normal. He exhibits no distension and no mass. There is no tenderness. There is no rebound and no guarding.  Musculoskeletal: Normal range of motion. He exhibits tenderness. He exhibits no  edema.  Patient has some erythema to the base of the left great toe.  He has mild tenderness with palpation and movement of the joint.  There is no crepitus, no signs of cellulitis, no warmth.  Lymphadenopathy:    He has no cervical adenopathy.  Neurological: He exhibits normal muscle tone.  Skin: Skin is warm and dry. No rash noted. There is erythema. No pallor.  Psychiatric: He has a normal mood and affect. His behavior is normal.  Judgment and thought content normal.  Nursing note and vitals reviewed.   ED Course  Procedures (including critical care time) Labs Review Labs Reviewed  I-STAT CHEM 8, ED - Abnormal; Notable for the following:    Creatinine, Ser 1.50 (*)    Glucose, Bld 285 (*)    All other components within normal limits  CBG MONITORING, ED - Abnormal; Notable for the following:    Glucose-Capillary 217 (*)    All other components within normal limits    Imaging Review No results found.   EKG Interpretation None      MDM   Final diagnoses:  Gout of big toe    29 year old male with left great toe pain consistent with prior gout attacks.  Patient noted be hypertensive, instructed to follow-up with [facility] within a week for recheck.    Linton Flemings, MD 12/23/14 [phone]

## 2014-12-23 NOTE — Discharge Instructions (Signed)
Take medications as prescribed.  Follow up with the [facility] for recheck of your blood pressure, blood sugar, and your gout in 5-7 days.   Gout Gout is an inflammatory arthritis caused by a buildup of uric acid crystals in the joints. Uric acid is a chemical that is normally present in the blood. When the level of uric acid in the blood is too high it can form crystals that deposit in your joints and tissues. This causes joint redness, soreness, and swelling (inflammation). Repeat attacks are common. Over time, uric acid crystals can form into masses (tophi) near a joint, destroying bone and causing disfigurement. Gout is treatable and often preventable. CAUSES  The disease begins with elevated levels of uric acid in the blood. Uric acid is produced by your body when it breaks down a naturally found substance called purines. Certain foods you eat, such as meats and fish, contain high amounts of purines. Causes of an elevated uric acid level include:  Being passed down from parent to child (heredity).  Diseases that cause increased uric acid production (such as obesity, psoriasis, and certain cancers).  Excessive alcohol use.  Diet, especially diets rich in meat and seafood.  Medicines, including certain cancer-fighting medicines (chemotherapy), water pills (diuretics), and aspirin.  Chronic kidney disease. The kidneys are no longer able to remove uric acid well.  Problems with metabolism. Conditions strongly associated with gout include:  Obesity.  High blood pressure.  High cholesterol.  Diabetes. Not everyone with elevated uric acid levels gets gout. It is not understood why some people get gout and others do not. Surgery, joint injury, and eating too much of certain foods are some of the factors that can lead to gout attacks. SYMPTOMS   An attack of gout comes on quickly. It causes intense pain with redness, swelling, and warmth in a joint.  Fever can occur.  Often,  only one joint is involved. Certain joints are more commonly involved:  Base of the big toe.  Knee.  Ankle.  Wrist.  Finger. Without treatment, an attack usually goes away in a few days to weeks. Between attacks, you usually will not have symptoms, which is different from many other forms of arthritis. DIAGNOSIS  Your caregiver will suspect gout based on your symptoms and exam. In some cases, tests may be recommended. The tests may include:  Blood tests.  Urine tests.  X-rays.  Joint fluid exam. This exam requires a needle to remove fluid from the joint (arthrocentesis). Using a microscope, gout is confirmed when uric acid crystals are seen in the joint fluid. TREATMENT  There are two phases to gout treatment: treating the sudden onset (acute) attack and preventing attacks (prophylaxis).  Treatment of an Acute Attack.  Medicines are used. These include anti-inflammatory medicines or steroid medicines.  An injection of steroid medicine into the affected joint is sometimes necessary.  The painful joint is rested. Movement can worsen the arthritis.  You may use warm or cold treatments on painful joints, depending which works best for you.  Treatment to Prevent Attacks.  If you suffer from frequent gout attacks, your caregiver may advise preventive medicine. These medicines are started after the acute attack subsides. These medicines either help your kidneys eliminate uric acid from your body or decrease your uric acid production. You may need to stay on these medicines for a very long time.  The early phase of treatment with preventive medicine can be associated with an increase in acute gout attacks. For  this reason, during the first few months of treatment, your caregiver may also advise you to take medicines usually used for acute gout treatment. Be sure you understand your caregiver's directions. Your caregiver may make several adjustments to your medicine dose before these  medicines are effective.  Discuss dietary treatment with your caregiver or dietitian. Alcohol and drinks high in sugar and fructose and foods such as meat, poultry, and seafood can increase uric acid levels. Your caregiver or dietitian can advise you on drinks and foods that should be limited. HOME CARE INSTRUCTIONS   Do not take aspirin to relieve pain. This raises uric acid levels.  Only take over-the-counter or prescription medicines for pain, discomfort, or fever as directed by your caregiver.  Rest the joint as much as possible. When in bed, keep sheets and blankets off painful areas.  Keep the affected joint raised (elevated).  Apply warm or cold treatments to painful joints. Use of warm or cold treatments depends on which works best for you.  Use crutches if the painful joint is in your leg.  Drink enough fluids to keep your urine clear or pale yellow. This helps your body get rid of uric acid. Limit alcohol, sugary drinks, and fructose drinks.  Follow your dietary instructions. Pay careful attention to the amount of protein you eat. Your daily diet should emphasize fruits, vegetables, whole grains, and fat-free or low-fat milk products. Discuss the use of coffee, vitamin C, and cherries with your caregiver or dietitian. These may be helpful in lowering uric acid levels.  Maintain a healthy body weight. SEEK MEDICAL CARE IF:   You develop diarrhea, vomiting, or any side effects from medicines.  You do not feel better in 24 hours, or you are getting worse. SEEK IMMEDIATE MEDICAL CARE IF:   Your joint becomes suddenly more tender, and you have chills or a fever. MAKE SURE YOU:   Understand these instructions.  Will watch your condition.  Will get help right away if you are not doing well or get worse. Document Released: 08/29/2000 Document Revised: 01/16/2014 Document Reviewed: 04/14/2012 [facility] Patient Information 2015 [place], [place]. This information is not intended to  replace advice given to you by your health care provider. Make sure you discuss any questions you have with your health care provider.  Low-Purine Diet Purines are compounds that affect the level of uric acid in your body. A low-purine diet is a diet that is low in purines. Eating a low-purine diet can prevent the level of uric acid in your body from getting too high and causing gout or kidney stones or both. WHAT DO I NEED TO KNOW ABOUT THIS DIET?  Choose low-purine foods. Examples of low-purine foods are listed in the next section.  Drink plenty of fluids, especially water. Fluids can help remove uric acid from your body. Try to drink 8-16 cups (1.9-3.8 L) a day.  Limit foods high in fat, especially saturated fat, as fat makes it harder for the body to get rid of uric acid. Foods high in saturated fat include pizza, cheese, ice cream, whole milk, fried foods, and gravies. Choose foods that are lower in fat and lean sources of protein. Use olive oil when cooking as it contains healthy fats that are not high in saturated fat.  Limit alcohol. Alcohol interferes with the elimination of uric acid from your body. If you are having a gout attack, avoid all alcohol.  Keep in mind that different people's bodies react differently to different foods.  You will probably learn over time which foods do or do not affect you. If you discover that a food tends to cause your gout to flare up, avoid eating that food. You can more freely enjoy foods that do not cause problems. If you have any questions about a food item, talk to your dietitian or health care provider. WHICH FOODS ARE LOW, MODERATE, AND HIGH IN PURINES? The following is a list of foods that are low, moderate, and high in purines. You can eat any amount of the foods that are low in purines. You may be able to have small amounts of foods that are moderate in purines. Ask your health care provider how much of a food moderate in purines you can have. Avoid  foods high in purines. Grains  Foods low in purines: Enriched white bread, pasta, rice, cake, cornbread, popcorn.  Foods moderate in purines: Whole-grain breads and cereals, wheat germ, bran, oatmeal. Uncooked oatmeal. Dry wheat bran or wheat germ.  Foods high in purines: Pancakes, French toast, biscuits, muffins. Vegetables  Foods low[place] in purines: All vegetables, except those that are moderate in purines.  Foods moderate in purines: Asparagus, cauliflower, spinach, mushrooms, green peas. Fruits  All fruits are low in purines. Meats and other Protein Foods  Foods low in purines: Eggs, nuts, peanut butter.  Foods moderate in purines: 80-90% lean beef, lamb, veal, pork, poultry, fish, eggs, peanut butter, nuts. Crab, lobster, oysters, and shrimp. Cooked dried beans, peas, and lentils.  Foods high in purines: Anchovies, sardines, herring, mussels, tuna, codfish, scallops, trout, and haddock. Tomasa BlaseBacon. Organ meats (such as liver or kidney). Tripe. Game meat. Goose. Sweetbreads. Dairy  All dairy foods are low in purines. Low-fat and fat-free dairy products are best because they are low in saturated fat. Beverages  Drinks low in purines: Water, carbonated beverages, tea, coffee, cocoa.  Drinks moderate in purines: Soft drinks and other drinks sweetened with high-fructose corn syrup. Juices. To find whether a food or drink is sweetened with high-fructose corn syrup, look at the ingredients list.  Drinks high in purines: Alcoholic beverages (such as beer). Condiments  Foods low in purines: Salt, herbs, olives, pickles, relishes, vinegar.  Foods moderate in purines: Butter, margarine, oils, mayonnaise. Fats and Oils  Foods low in purines: All types, except gravies and sauces made with meat.  Foods high in purines: Gravies and sauces made with meat. Other Foods  Foods low in purines: Sugars, sweets, gelatin. Cake. Soups made without meat.  Foods moderate in purines: Meat-based or  fish-based soups, broths, or bouillons. Foods and drinks sweetened with high-fructose corn syrup.  Foods high in purines: High-fat desserts (such as ice cream, cookies, cakes, pies, doughnuts, and chocolate). Contact your dietitian for more information on foods that are not listed here. Document Released: 12/27/2010 Document Revised: 09/06/2013 Document Reviewed: 08/08/2013 [facility] Patient Information 2015 [place]. This information is not intended to replace advice given to you by your health care provider. Make sure you discuss any questions you have with your health care provider.

## 2015-05-21 ENCOUNTER — Emergency Department (HOSPITAL_COMMUNITY)
Admission: EM | Admit: 2015-05-21 | Discharge: 2015-05-21 | Disposition: A | Discharge status: Home / Self Care | Payer: Self-pay | Attending: Emergency Medicine | Admitting: Emergency Medicine

## 2015-05-21 ENCOUNTER — Encounter (HOSPITAL_COMMUNITY): Payer: Self-pay | Admitting: Emergency Medicine

## 2015-05-21 DIAGNOSIS — Z720 Tobacco use: Secondary | ICD-10-CM | POA: Insufficient documentation

## 2015-05-21 DIAGNOSIS — Z79899 Other long term (current) drug therapy: Secondary | ICD-10-CM | POA: Insufficient documentation

## 2015-05-21 DIAGNOSIS — J020 Streptococcal pharyngitis: Secondary | ICD-10-CM | POA: Insufficient documentation

## 2015-05-21 DIAGNOSIS — Z880 Allergy status to penicillin: Secondary | ICD-10-CM | POA: Insufficient documentation

## 2015-05-21 DIAGNOSIS — H9203 Otalgia, bilateral: Secondary | ICD-10-CM | POA: Insufficient documentation

## 2015-05-21 DIAGNOSIS — Z7951 Long term (current) use of inhaled steroids: Secondary | ICD-10-CM | POA: Insufficient documentation

## 2015-05-21 DIAGNOSIS — Z794 Long term (current) use of insulin: Secondary | ICD-10-CM | POA: Insufficient documentation

## 2015-05-21 DIAGNOSIS — M109 Gout, unspecified: Secondary | ICD-10-CM | POA: Insufficient documentation

## 2015-05-21 DIAGNOSIS — E119 Type 2 diabetes mellitus without complications: Secondary | ICD-10-CM | POA: Insufficient documentation

## 2015-05-21 LAB — RAPID STREP SCREEN (MED CTR MEBANE ONLY): Streptococcus, Group A Screen (Direct): POSITIVE — AB

## 2015-05-21 MED ORDER — AZITHROMYCIN 250 MG PO TABS: 250.0000 mg | ORAL_TABLET | Freq: Every day | ORAL | Status: DC

## 2015-05-21 MED ORDER — AZITHROMYCIN 250 MG PO TABS
500.0000 mg | ORAL_TABLET | Freq: Once | ORAL | Status: AC
  Administered 2015-05-21: 500 mg via ORAL
  Filled 2015-05-21: qty 2

## 2015-05-21 NOTE — ED Provider Notes (Signed)
CSN: [id number]     Arrival date & time 05/21/15  1906 History  This chart was scribed for Jimmy Lange, PA-C, working with Merrily Pew, MD by [name], ED Scribe. This patient was seen in room WTR7/WTR7 and the patient's care was started at 8:54 PM.   Chief Complaint  Patient presents with  . Sore Throat  . Chills  . Facial Pain   The history is provided by the patient. No language interpreter was used.    HPI Comments: Jimmy Jordan is a 29 y.o. male with no chronic conditions who presents to the Emergency Department complaining of a constant, moderate, unchanged sore throat onset yesterday.  Associated symptoms include bilateral ear pain, headache, subjective fever, and hot/cold chills.  Patient is tolerating food and fluids well.  He denies history of strep throat.  He denies nausea, vomiting, cough.  Past Medical History  Diagnosis Date  . Bronchitis   . Gout   . Gout   . Diabetes mellitus without complication    History reviewed. No pertinent past surgical history. Family History  Problem Relation Age of Onset  . Hypertension Mother   . Diabetes Father    Social History  Substance Use Topics  . Smoking status: Current Every Day Smoker -- 0.50 packs/day    Types: Cigarettes  . Smokeless tobacco: None  . Alcohol Use: No     Comment: occasional    Review of Systems  Constitutional: Positive for fever and chills.  HENT: Positive for ear pain and sore throat. Negative for trouble swallowing.   Respiratory: Negative for cough.   Gastrointestinal: Negative for vomiting.  Neurological: Positive for headaches.      Allergies  Penicillins  Home Medications   Prior to Admission medications   Medication Sig Start Date End Date Taking? Authorizing Provider  albuterol (PROVENTIL HFA;VENTOLIN HFA) 108 (90 BASE) MCG/ACT inhaler Inhale 1-2 puffs into the lungs every 6 (six) hours as needed for wheezing or shortness of breath. 08/21/14   Courtney Forcucci, PA-C   fluticasone (FLONASE) 50 MCG/ACT nasal spray Place 2 sprays into both nostrils daily. 08/21/14   Courtney Forcucci, PA-C  glucose monitoring kit (FREESTYLE) monitoring kit 1 each by Does not apply route as needed for other. 11/17/14   Kelvin Cellar, MD  HYDROcodone-acetaminophen (NORCO/VICODIN) 5-325 MG per tablet Take 2 tablets by mouth every 4 (four) hours as needed. 12/23/14   Linton Flemings, MD  indomethacin (INDOCIN) 50 MG capsule Take 1 capsule (50 mg total) by mouth 3 (three) times daily as needed for mild pain or moderate pain. 12/23/14   Linton Flemings, MD  insulin aspart protamine- aspart (NOVOLOG MIX 70/30) (70-30) 100 UNIT/ML injection Inject 0.2 mLs (20 Units total) into the skin 2 (two) times daily with a meal. 11/17/14   Kelvin Cellar, MD  metFORMIN (GLUCOPHAGE) 500 MG tablet Take 1 tablet (500 mg total) by mouth 2 (two) times daily with a meal. Patient taking differently: Take 500 mg by mouth 3 (three) times daily.  11/20/14   Micheline Chapman, NP   BP 150/91 mmHg  Pulse 94  Temp(Src) 98.4 F (36.9 C) (Oral)  Resp 18  SpO2 97% Physical Exam  Constitutional: He is oriented to person, place, and time. He appears well-developed and well-nourished. No distress.  HENT:  Head: Normocephalic and atraumatic.  TM's clear bilaterally.  Oropharynx has erythematous tonsils with exudates bilaterally.  Uvula is midline.  Mucosa moist.  Cervical adenopathy.   Eyes: Conjunctivae and EOM are normal.  Neck: Neck supple. No tracheal deviation present.  Cardiovascular: Normal rate.   Pulmonary/Chest: Effort normal. No respiratory distress.  No respiratory distress.   Musculoskeletal: Normal range of motion.  Neurological: He is alert and oriented to person, place, and time.  Skin: Skin is warm and dry.  Psychiatric: He has a normal mood and affect. His behavior is normal.  Nursing note and vitals reviewed.   ED Course  Procedures (including critical care time)  DIAGNOSTIC STUDIES: Oxygen  Saturation is 97% on RA, normal by my interpretation.    COORDINATION OF CARE:  9:00 PM Discussed treatment plan with patient at bedside.  Patient acknowledges and agrees with plan.    Labs Review Labs Reviewed  RAPID STREP SCREEN (NOT AT [facility]) - Abnormal; Notable for the following:    Streptococcus, Group A Screen (Direct) POSITIVE (*)    All other components within normal limits    Imaging Review No results found. I have personally reviewed and evaluated these images and lab results as part of my medical decision-making.   EKG Interpretation None      MDM   Final diagnoses:  None    1. Strep throat  Penicillin allergic patient, unknown reaction, treated with Zithromax and supportive care.   I personally performed the services described in this documentation, which was scribed in my presence. The recorded information has been reviewed and is accurate.     Jimmy Lange, PA-C 05/21/15 2249  Merrily Pew, MD 05/22/15 [phone]

## 2015-05-21 NOTE — Discharge Instructions (Signed)
Strep Throat Tests °While most sore throats are caused by viruses, at times they are caused by a bacteria called group A Streptococci (strep throat). It is important to determine the cause because the strep bacteria is treated with antibiotic medication. °There are 2 types of tests for strep throat: a rapid strep test and a throat culture. Both tests are done by wiping a swab over the back of the throat and then using chemicals to identify the type of bacteria present. The rapid strep test takes 10 to 20 minutes. If the rapid strep test is negative, a throat culture may be performed to confirm the results. With a throat culture, the swab is used to spread the bacteria on a gel plate and grow it in a lab, which may take 1 to 2 days. In some cases, the culture will detect strep bacteria not found with the rapid strep test. If the result of the rapid strep test is positive, no further testing is needed, and your caregiver will prescribe antibiotics. °Not all test results are available during your visit. If your test results are not back during the visit, make an appointment with your caregiver to find out the results. Do not assume everything is normal if you have not heard from your caregiver or the medical facility. It is important for you to follow up on all of your test results. °SEEK MEDICAL CARE IF:  °· Your symptoms are not improving within 1 to 2 days, or you are getting worse. °· You have any other questions or concerns. °SEEK IMMEDIATE MEDICAL CARE IF:  °· You have increased difficulty with swallowing. °· You develop trouble breathing. °· You have a fever. °Document Released: 10/09/2004 Document Revised: 11/24/2011 Document Reviewed: 12/07/2013 °Exit[employer]® Patient Information ©2015 [employer]. This information is not intended to replace advice given to you by your health care provider. Make sure you discuss any questions you have with your health care provider. ° °

## 2015-05-21 NOTE — ED Notes (Signed)
Pt reports sore throat, head pain, neck soreness and chills starting yesterday. Denies nausea and vomiting but has had diarrhea x4 yesterday. Also reports nasal congestion. No other c/c.

## 2015-06-13 ENCOUNTER — Emergency Department (HOSPITAL_COMMUNITY)
Admission: EM | Admit: 2015-06-13 | Discharge: 2015-06-13 | Disposition: A | Discharge status: Home / Self Care | Payer: Self-pay | Attending: Emergency Medicine | Admitting: Emergency Medicine

## 2015-06-13 ENCOUNTER — Encounter (HOSPITAL_COMMUNITY): Payer: Self-pay | Admitting: Emergency Medicine

## 2015-06-13 DIAGNOSIS — M109 Gout, unspecified: Secondary | ICD-10-CM | POA: Insufficient documentation

## 2015-06-13 DIAGNOSIS — E119 Type 2 diabetes mellitus without complications: Secondary | ICD-10-CM | POA: Insufficient documentation

## 2015-06-13 DIAGNOSIS — M7918 Myalgia, other site: Secondary | ICD-10-CM

## 2015-06-13 DIAGNOSIS — Z794 Long term (current) use of insulin: Secondary | ICD-10-CM | POA: Insufficient documentation

## 2015-06-13 DIAGNOSIS — M791 Myalgia: Secondary | ICD-10-CM | POA: Insufficient documentation

## 2015-06-13 DIAGNOSIS — Z792 Long term (current) use of antibiotics: Secondary | ICD-10-CM | POA: Insufficient documentation

## 2015-06-13 DIAGNOSIS — Z720 Tobacco use: Secondary | ICD-10-CM | POA: Insufficient documentation

## 2015-06-13 DIAGNOSIS — Z8709 Personal history of other diseases of the respiratory system: Secondary | ICD-10-CM | POA: Insufficient documentation

## 2015-06-13 DIAGNOSIS — Z7951 Long term (current) use of inhaled steroids: Secondary | ICD-10-CM | POA: Insufficient documentation

## 2015-06-13 DIAGNOSIS — M545 Low back pain: Secondary | ICD-10-CM | POA: Insufficient documentation

## 2015-06-13 DIAGNOSIS — Z79899 Other long term (current) drug therapy: Secondary | ICD-10-CM | POA: Insufficient documentation

## 2015-06-13 DIAGNOSIS — Z880 Allergy status to penicillin: Secondary | ICD-10-CM | POA: Insufficient documentation

## 2015-06-13 MED ORDER — DIAZEPAM 5 MG/ML IJ SOLN
5.0000 mg | Freq: Once | INTRAMUSCULAR | Status: AC
  Administered 2015-06-13: 5 mg via INTRAMUSCULAR
  Filled 2015-06-13: qty 2

## 2015-06-13 MED ORDER — DIAZEPAM 5 MG PO TABS
5.0000 mg | ORAL_TABLET | Freq: Two times a day (BID) | ORAL | Status: DC
Start: 2015-06-13 — End: 2015-07-12

## 2015-06-13 MED ORDER — NAPROXEN 500 MG PO TABS: 500.0000 mg | ORAL_TABLET | Freq: Two times a day (BID) | ORAL | Status: DC

## 2015-06-13 MED ORDER — KETOROLAC TROMETHAMINE 30 MG/ML IJ SOLN
30.0000 mg | Freq: Once | INTRAMUSCULAR | Status: AC
  Administered 2015-06-13: 30 mg via INTRAMUSCULAR
  Filled 2015-06-13: qty 1

## 2015-06-13 NOTE — ED Provider Notes (Signed)
CSN: [id number]     Arrival date & time 06/13/15  0915 History   First MD Initiated Contact with Patient 06/13/15 1003     Chief Complaint  Patient presents with  . Back Pain     (Consider location/radiation/quality/duration/timing/severity/associated sxs/prior Treatment) HPI Comments: Pt is a 29 yo male who presents to the ED with complaint of lower back pain, onset 1 week. Pt reports having constant sharp lower back pain without radiation. He notes the pain worsens with sitting. Pt denies fever, numbness, tingling, loss of bowel or bladder, weakness, IVDU, cancer or recent spinal manipulation. He notes he has taken Flexeril at home without relief. Denies any recent fall, trauma or injury. 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     Past Medical History  Diagnosis Date  . Bronchitis   . Gout   . Gout   . Diabetes mellitus without complication    History reviewed. No pertinent past surgical history. Family History  Problem Relation Age of Onset  . Hypertension Mother   . Diabetes Father    Social History  Substance Use Topics  . Smoking status: Current Every Day Smoker -- 0.50 packs/day    Types: Cigarettes  . Smokeless tobacco: None  . Alcohol Use: No     Comment: occasional    Review of Systems  Musculoskeletal: Positive for back pain.  All other systems reviewed and are negative.     Allergies  Penicillins  Home Medications   Prior to Admission medications   Medication Sig Start Date End Date Taking? Authorizing Provider  albuterol (PROVENTIL HFA;VENTOLIN HFA) 108 (90 BASE) MCG/ACT inhaler Inhale 1-2 puffs into the lungs every 6 (six) hours as needed for wheezing or shortness of breath. 08/21/14   Courtney Forcucci, PA-C  azithromycin (ZITHROMAX Z-PAK) 250 MG tablet Take 1 tablet (250 mg total) by mouth daily. 05/21/15   [name], PA-C  fluticasone (FLONASE) 50 MCG/ACT nasal spray Place 2 sprays into  both nostrils daily. 08/21/14   Courtney Forcucci, PA-C  glucose monitoring kit (FREESTYLE) monitoring kit 1 each by Does not apply route as needed for other. 11/17/14   Kelvin Cellar, MD  HYDROcodone-acetaminophen (NORCO/VICODIN) 5-325 MG per tablet Take 2 tablets by mouth every 4 (four) hours as needed. 12/23/14   Linton Flemings, MD  indomethacin (INDOCIN) 50 MG capsule Take 1 capsule (50 mg total) by mouth 3 (three) times daily as needed for mild pain or moderate pain. 12/23/14   Linton Flemings, MD  insulin aspart protamine- aspart (NOVOLOG MIX 70/30) (70-30) 100 UNIT/ML injection Inject 0.2 mLs (20 Units total) into the skin 2 (two) times daily with a meal. 11/17/14   Kelvin Cellar, MD  metFORMIN (GLUCOPHAGE) 500 MG tablet Take 1 tablet (500 mg total) by mouth 2 (two) times daily with a meal. Patient taking differently: Take 500 mg by mouth 3 (three) times daily.  11/20/14   Micheline Chapman, NP   BP 146/85 mmHg  Pulse 75  Temp(Src) 98 F (36.7 C) (Oral)  Resp 17  SpO2 98% Physical Exam  Constitutional: He is oriented to person, place, and time. He appears well-developed and well-nourished. No distress.  HENT:  Head: Normocephalic and atraumatic.  Eyes: Conjunctivae and EOM are normal. Right eye exhibits no discharge. Left eye exhibits no discharge. No scleral icterus.  Neck: Normal range of motion. Neck supple.  Pulmonary/Chest: Effort normal.  Musculoskeletal: Normal range of motion. He exhibits no  edema.       Lumbar back: He exhibits tenderness. He exhibits normal range of motion, no bony tenderness, no swelling, no edema, no deformity and no spasm.  TTP at bilateral lumbar paraspinal muscles. Pt able to stand and ambulate in room.  Neurological: He is alert and oriented to person, place, and time. He has normal strength and normal reflexes. No sensory deficit. Coordination and gait normal.  Skin: He is not diaphoretic.  Nursing note and vitals reviewed.   ED Course  Procedures (including  critical care time) Labs Review Labs Reviewed - No data to display  Imaging Review No results found. I have personally reviewed and evaluated these images and lab results as part of my medical decision-making.  Filed Vitals:   06/13/15 0923  BP: 146/85  Pulse: 75  Temp: 98 F (36.7 C)  Resp: 17     MDM   Final diagnoses:  Bilateral low back pain, with sciatica presence unspecified  Musculoskeletal pain    Pt presents with lower back pain. No reports injury, fall or trauma. Pt reports occasionally lifting heavy objects at work. VSS. TTP at lumbar paraspinal muscles, no spasm. Pt able to stand and ambulate in room. 5/5 strength BLE, DTRs nml. No back pain red flags, no sciatic pain. Pt given muscle relaxant and toradol in ED. Pt reports pain has improved. Plan to d/c pt home with rx for naproxen and valium. Advised pt to follow up with [facility].  Evaluation does not show pathology requring ongoing emergent intervention or admission. Pt is hemodynamically stable and mentating appropriately. Discussed findings/results and plan with patient/guardian, who agrees with plan. All questions answered. Return precautions discussed and outpatient follow up given.         Chesley Noon [place], [place] 06/13/15 1142  Evelina Bucy, MD 06/13/15 [phone]

## 2015-06-13 NOTE — ED Notes (Signed)
Per pt, states lower back pain for about a week

## 2015-06-13 NOTE — Discharge Instructions (Signed)
Please take your prescriptions as prescribed for pain relief. Please follow up at the [facility] in 1 week. Please return to the Emergency Department if symptoms worsen or new onset of fever, numbness, tingling, weakness, loss control of bowel or bladder.

## 2015-06-25 ENCOUNTER — Emergency Department (HOSPITAL_COMMUNITY)
Admission: EM | Admit: 2015-06-25 | Discharge: 2015-06-25 | Disposition: A | Discharge status: Home / Self Care | Payer: Self-pay | Attending: Emergency Medicine | Admitting: Emergency Medicine

## 2015-06-25 ENCOUNTER — Emergency Department (HOSPITAL_COMMUNITY): Payer: Self-pay

## 2015-06-25 ENCOUNTER — Encounter (HOSPITAL_COMMUNITY): Payer: Self-pay | Admitting: *Deleted

## 2015-06-25 DIAGNOSIS — M109 Gout, unspecified: Secondary | ICD-10-CM | POA: Insufficient documentation

## 2015-06-25 DIAGNOSIS — Z791 Long term (current) use of non-steroidal anti-inflammatories (NSAID): Secondary | ICD-10-CM | POA: Insufficient documentation

## 2015-06-25 DIAGNOSIS — Z79899 Other long term (current) drug therapy: Secondary | ICD-10-CM | POA: Insufficient documentation

## 2015-06-25 DIAGNOSIS — M545 Low back pain: Secondary | ICD-10-CM | POA: Insufficient documentation

## 2015-06-25 DIAGNOSIS — M549 Dorsalgia, unspecified: Secondary | ICD-10-CM

## 2015-06-25 DIAGNOSIS — Z8709 Personal history of other diseases of the respiratory system: Secondary | ICD-10-CM | POA: Insufficient documentation

## 2015-06-25 DIAGNOSIS — Z880 Allergy status to penicillin: Secondary | ICD-10-CM | POA: Insufficient documentation

## 2015-06-25 DIAGNOSIS — E119 Type 2 diabetes mellitus without complications: Secondary | ICD-10-CM | POA: Insufficient documentation

## 2015-06-25 DIAGNOSIS — Z720 Tobacco use: Secondary | ICD-10-CM | POA: Insufficient documentation

## 2015-06-25 DIAGNOSIS — Z794 Long term (current) use of insulin: Secondary | ICD-10-CM | POA: Insufficient documentation

## 2015-06-25 MED ORDER — IBUPROFEN 800 MG PO TABS: 800.0000 mg | ORAL_TABLET | Freq: Three times a day (TID) | ORAL | Status: DC

## 2015-06-25 MED ORDER — HYDROCODONE-ACETAMINOPHEN 5-325 MG PO TABS: 2.0000 | ORAL_TABLET | ORAL | Status: DC | PRN

## 2015-06-25 MED ORDER — METHOCARBAMOL 500 MG PO TABS: 500.0000 mg | ORAL_TABLET | Freq: Two times a day (BID) | ORAL | Status: DC

## 2015-06-25 MED ORDER — HYDROCODONE-ACETAMINOPHEN 5-325 MG PO TABS
2.0000 | ORAL_TABLET | Freq: Once | ORAL | Status: AC
  Administered 2015-06-25: 2 via ORAL
  Filled 2015-06-25: qty 2

## 2015-06-25 NOTE — ED Notes (Signed)
Pt reports low back pain, which is chronic.  Pt reports pinched nerve.  Was seen here last week for same and was given naproxen and muscle relaxers without relief.   Pt reports months ago, he was given a cortisone injection which gave him some comfort.  Was given referral to see a chiropractor which he did  Not follow up.

## 2015-06-25 NOTE — Discharge Instructions (Signed)
1. Medications: pain medicine, ibuprofen, robaxin, usual home medications 2. Treatment: rest, drink plenty of fluids, back exercises 3. Follow Up: please followup with your primary doctor in 2-3 days for discussion of your diagnoses and further evaluation after today's visit; if you do not have a primary care doctor use the resource guide provided to find one; please return to the ER for severe pain, weakness, bowel or bladder incontinence, numbness in your groin, new or worsening symptoms   Back Exercises If you have pain in your back, do these exercises 2-3 times each day or as told by your doctor. When the pain goes away, do the exercises once each day, but repeat the steps more times for each exercise (do more repetitions). If you do not have pain in your back, do these exercises once each day or as told by your doctor. EXERCISES Single Knee to Chest Do these steps 3-5 times in a row for each leg:  Lie on your back on a firm bed or the floor with your legs stretched out.  Bring one knee to your chest.  Hold your knee to your chest by grabbing your knee or thigh.  Pull on your knee until you feel a gentle stretch in your lower back.  Keep doing the stretch for 10-30 seconds.  Slowly let go of your leg and straighten it. Pelvic Tilt Do these steps 5-10 times in a row:  Lie on your back on a firm bed or the floor with your legs stretched out.  Bend your knees so they point up to the ceiling. Your feet should be flat on the floor.  Tighten your lower belly (abdomen) muscles to press your lower back against the floor. This will make your tailbone point up to the ceiling instead of pointing down to your feet or the floor.  Stay in this position for 5-10 seconds while you gently tighten your muscles and breathe evenly. Cat-Cow Do these steps until your lower back bends more easily:  Get on your hands and knees on a firm surface. Keep your hands under your shoulders, and keep your  knees under your hips. You may put padding under your knees.  Let your head hang down, and make your tailbone point down to the floor so your lower back is round like the back of a cat.  Stay in this position for 5 seconds.  Slowly lift your head and make your tailbone point up to the ceiling so your back hangs low (sags) like the back of a cow.  Stay in this position for 5 seconds. Press-Ups Do these steps 5-10 times in a row: 1. Lie on your belly (face-down) on the floor. 2. Place your hands near your head, about shoulder-width apart. 3. While you keep your back relaxed and keep your hips on the floor, slowly straighten your arms to raise the top half of your body and lift your shoulders. Do not use your back muscles. To make yourself more comfortable, you may change where you place your hands. 4. Stay in this position for 5 seconds. 5. Slowly return to lying flat on the floor. Bridges Do these steps 10 times in a row: 1. Lie on your back on a firm surface. 2. Bend your knees so they point up to the ceiling. Your feet should be flat on the floor. 3. Tighten your butt muscles and lift your butt off of the floor until your waist is almost as high as your knees. If you do  not feel the muscles working in your butt and the back of your thighs, slide your feet 1-2 inches farther away from your butt. 4. Stay in this position for 3-5 seconds. 5. Slowly lower your butt to the floor, and let your butt muscles relax. If this exercise is too easy, try doing it with your arms crossed over your chest. Belly Crunches Do these steps 5-10 times in a row: 1. Lie on your back on a firm bed or the floor with your legs stretched out. 2. Bend your knees so they point up to the ceiling. Your feet should be flat on the floor. 3. Cross your arms over your chest. 4. Tip your chin a little bit toward your chest but do not bend your neck. 5. Tighten your belly muscles and slowly raise your chest just enough to  lift your shoulder blades a tiny bit off of the floor. 6. Slowly lower your chest and your head to the floor. Back Lifts Do these steps 5-10 times in a row: 1. Lie on your belly (face-down) with your arms at your sides, and rest your forehead on the floor. 2. Tighten the muscles in your legs and your butt. 3. Slowly lift your chest off of the floor while you keep your hips on the floor. Keep the back of your head in line with the curve in your back. Look at the floor while you do this. 4. Stay in this position for 3-5 seconds. 5. Slowly lower your chest and your face to the floor. GET HELP IF:  Your back pain gets a lot worse when you do an exercise.  Your back pain does not lessen 2 hours after you exercise. If you have any of these problems, stop doing the exercises. Do not do them again unless your doctor says it is okay. GET HELP RIGHT AWAY IF:  You have sudden, very bad back pain. If this happens, stop doing the exercises. Do not do them again unless your doctor says it is okay.   This information is not intended to replace advice given to you by your health care provider. Make sure you discuss any questions you have with your health care provider.   Document Released: 10/04/2010 Document Revised: 05/23/2015 Document Reviewed: 10/26/2014 Elsevier Interactive Patient Education 2016 Elsevier Inc.  Back Pain, Adult Back pain is very common. The pain often gets better over time. The cause of back pain is usually not dangerous. Most people can learn to manage their back pain on their own.  HOME CARE  Watch your back pain for any changes. The following actions may help to lessen any pain you are feeling:  Stay active. Start with short walks on flat ground if you can. Try to walk farther each day.  Exercise regularly as told by your doctor. Exercise helps your back heal faster. It also helps avoid future injury by keeping your muscles strong and flexible.  Do not sit, drive, or stand  in one place for more than 30 minutes.  Do not stay in bed. Resting more than 1-2 days can slow down your recovery.  Be careful when you bend or lift an object. Use good form when lifting:  Bend at your knees.  Keep the object close to your body.  Do not twist.  Sleep on a firm mattress. Lie on your side, and bend your knees. If you lie on your back, put a pillow under your knees.  Take medicines only as told by your  doctor.  Put ice on the injured area.  Put ice in a plastic bag.  Place a towel between your skin and the bag.  Leave the ice on for 20 minutes, 2-3 times a day for the first 2-3 days. After that, you can switch between ice and heat packs.  Avoid feeling anxious or stressed. Find good ways to deal with stress, such as exercise.  Maintain a healthy weight. Extra weight puts stress on your back. GET HELP IF:   You have pain that does not go away with rest or medicine.  You have worsening pain that goes down into your legs or buttocks.  You have pain that does not get better in one week.  You have pain at night.  You lose weight.  You have a fever or chills. GET HELP RIGHT AWAY IF:   You cannot control when you poop (bowel movement) or pee (urinate).  Your arms or legs feel weak.  Your arms or legs lose feeling (numbness).  You feel sick to your stomach (nauseous) or throw up (vomit).  You have belly (abdominal) pain.  You feel like you may pass out (faint).   This information is not intended to replace advice given to you by your health care provider. Make sure you discuss any questions you have with your health care provider.   Document Released: 02/18/2008 Document Revised: 09/22/2014 Document Reviewed: 01/03/2014 Elsevier Interactive Patient Education 2016 [employer].   Emergency Department Resource Guide 1) Find a Doctor and Pay Out of Pocket Although you won't have to find out who is covered by your insurance plan, it is a good idea to  ask around and get recommendations. You will then need to call the office and see if the doctor you have chosen will accept you as a new patient and what types of options they offer for patients who are self-pay. Some doctors offer discounts or will set up payment plans for their patients who do not have insurance, but you will need to ask so you aren't surprised when you get to your appointment.  2) Contact Your Local Health Department Not all health departments have doctors that can see patients for sick visits, but many do, so it is worth a call to see if yours does. If you don't know where your local health department is, you can check in your phone book. The CDC also has a tool to help you locate your state's health department, and many state websites also have listings of all of their local health departments.  3) Find a Walk-in Clinic If your illness is not likely to be very severe or complicated, you may want to try a walk in clinic. These are popping up all over the country in pharmacies, drugstores, and shopping centers. They're usually staffed by nurse practitioners or physician assistants that have been trained to treat common illnesses and complaints. They're usually fairly quick and inexpensive. However, if you have serious medical issues or chronic medical problems, these are probably not your best option.  No Primary Care Doctor: - Call Health Connect at  [phone] - they can help you locate a primary care doctor that  accepts your insurance, provides certain services, etc. - Physician Referral Service- [phone]  Chronic Pain Problems: Organization         Address  Phone   Notes  [facility]  [phone] Patients need to be referred by their primary care doctor.   Medication Assistance: Organization  Address  Phone   Notes  [facility] Medication [facility] [address] [place], [place] [address] [phone] --Must be a  resident of [facility] -- Must have NO insurance coverage whatsoever (no Medicaid/ Medicare, etc.) -- The pt. MUST have a primary care doctor that directs their care regularly and follows them in the community   MedAssist  [phone]   [employer]  [phone]    Agencies that provide inexpensive medical care: Organization         Address  Phone   Notes  [facility]  [phone]   [facility]    [phone]   [facility] [address] [place], [place] [address] [phone]   [facility] [address] [place], [place] [phone]   Planned Parenthood    [phone]   [facility]    [phone]   [facility]  [address], [place] Phone:  [phone], Fax:  [phone] Hours of Operation:  9 am - 6 pm, M-F.  Also accepts Medicaid/Medicare and self-pay.  [facility]  [address], [place] Phone: [phone], Fax: [phone]. Hours of Operation:  8:30 am - 5:30 pm, M-F.  Also accepts Medicaid and self-pay.  [facility] [place] [address], [place] Phone: [phone]   [facility] [address] Natasha Bence [place], [place] [phone], Ext. [phone] Mondays & Thursdays: 7-9 AM.  First 15 patients are seen on a first come, first serve basis.    Medicaid-accepting [facility] Providers:  Organization         Address  Phone   Notes  [facility] [address], [place] [phone] Also accepts self-pay patients.  [facility] [address] Laurell Josephs [place], [place]  [phone]   [facility] [address], [place] [phone]   [facility] [address] [place] [phone]   Renaye Rakers [address], [place]   [phone] Only accepts  [place] Access [place] patients after they have their name applied to their card.   Self-Pay (no insurance) in [facility]:  Organization         Address  Phone   Notes  Sickle Cell Patients, [facility] [address] [place], [place] [phone]   [facility] [address] [place], [place] [phone]   [facility] [place]  [address], [place] [phone]   [facility]/Dr. Osei-Bonsu  [address] [place] or [address], [place] [phone] Phone number for both [place] and [place] locations is the same.  [facility] [address] [place] [phone]   [facility] [address] [place] or [address] [phone]   [facility] [address], [place] [phone], phone; [phone], fax Sees patients 1st and 3rd Saturday of every month.  Must not qualify for public or private insurance (i.e. Medicaid, Medicare, [place] Health Choice, Veterans' Benefits)  Household income should be no more than 200% of the poverty level The clinic cannot treat you if you are pregnant or think you  are pregnant  Sexually transmitted diseases are not treated at the clinic.    Dental Care: Organization         Address  Phone  Notes  [facility] Department of [facility] [address] [place], [place] [phone] Accepts children up to age 56 who are enrolled in [place] or [place] Health Choice; pregnant women with a Medicaid card; and children who have applied for Medicaid or [place] Health Choice, but were declined, whose parents can pay a reduced fee at time of service.  [facility] [place]  [address], [place] [phone] Accepts children up to age 29 who are enrolled in [place] or [place] Health Choice; pregnant women  with a Medicaid card; and children who have applied for Medicaid or [place] Health Choice, but were declined, whose parents can pay a reduced fee at time of service.  [facility]  [address] [place], [place] [phone] Patients are seen by appointment only. Walk-ins are not accepted. [facility] will see patients 45 years of age and older. Monday - Tuesday (8am-5pm) Most Wednesdays (8:30-5pm) $30 per visit, cash only  [facility]  [address], [facility] [phone] Patients are seen by appointment only. Walk-ins are not accepted. [facility] will see patients 60 years of age and older. One Wednesday Evening (Monthly: Volunteer Based).  $30 per visit, cash only  [employer]  [phone] for adults; Children under age 78, call Graduate Pediatric Dentistry at [phone]. Children aged 79-14, please call [phone] to request a pediatric application.  Dental services are provided in all areas of dental care including fillings, crowns and bridges, complete and partial dentures, implants, gum treatment, root canals, and extractions. Preventive care is also provided. Treatment is provided to both adults and children. Patients are selected via a lottery and there is often a waiting list.   [facility] [address] [place]  [phone] [email or web address]   [facility] [address] [place], [place] [phone], Ext. [phone] Second and Fourth Thursday of each month, opens at 6:30 AM; Clinic ends at 9 AM.  Patients are seen on a first-come first-served basis, and a limited number are seen during each clinic.   [facility]  [address] Ether Griffins [place], [place] [phone]   Eligibility Requirements You must have lived in [place], [place], or [place] counties for at least the last three months.   You cannot be eligible for state or federal sponsored [employer], including [employer], [place], or [employer].   You generally cannot be eligible for healthcare insurance through your employer.    How to apply: Eligibility screenings are held every Tuesday and Wednesday afternoon from 1:00 pm until 4:00 pm. You do not need an appointment for the interview!  [facility] [address] [place], [place] [phone]   [address] [facility] Health Department  [phone]   [facility] Health Department  [phone]   [facility] Health Department  [phone]    Behavioral Health Resources in the Community: Intensive Outpatient Programs Organization         Address  Phone  Notes  [facility] Services [address] [place], [place] [phone]   [facility] Outpatient [address] [place], [place] [phone]   [facility] [address], [place], [place]  [phone]   [facility] Mental Health [address]  [place], [place] [phone] or [phone]   Substance Abuse Resources Organization         Address  Phone  Notes  [facility]  [phone]   [facility]  [phone]   [facility]  [phone]   Floydene Flock  [phone]   Residential & Outpatient Substance Abuse Program  [phone]   Psychological Services Organization         Address  Phone  Notes  [facility]  [phone]   [facility]  [phone]   [facility] Mental Health [address], [place] [phone] or [phone]    Mobile Crisis Teams Organization         Address  Phone  Notes  [facility], Mobile Crisis Care Unit  [phone]   [facility]  [address] [place], [place] [phone]   Doristine Locks [address] [place] [phone]    Self-Help/Support Groups Organization         Address  Phone             Notes  [facility]  [facility] [place] - variety of support groups  336- I7437963 Call for more information  Narcotics Anonymous (NA), [facility] [address], [employer] [place]  2 meetings at this location   Statistician         Address  Phone  Notes  [facility] [address],    [place]  [phone]   [facility]  [address] [place] [address], [place], [place] [phone]   [facility] [address] [place], [place] [phone] Admissions: 8am-3pm M-F  [facility] [address]    [place], [place] [phone]   [facility] [address] [place], [place], [place] [phone]   [facility] [address],  [place], [place] [phone]   [facility] - Intensive Outpatient [address]ure[address] Josephs [address], [place], [place] [phone]   [facility] ([facility] [address] [place].,  [place], [place] [phone] or [phone]   [facility] ([facility]) [address] [place], [place] [phone] Accepts Medicaid  [facility] [place] [address],  [place] [phone] Substance Abuse/Addiction Treatment   [facility] Organization         Address  Phone  Notes  [facility]  [phone]   Angie Fava, PhD [address] Ervin Knack [place], [place]   [phone] or [phone]   [facility] Behavioral   [address] [place], [place] [phone]   [facility] [address] [place], [place] [phone] Insurance/Medicaid/sponsorship through [facility] and Families [address]                                    [place], [place] [phone] Therapy/tele-psych/case  [facility] [address], [place] [phone]    Dr. Lolly Mustache  [phone]   [facility]  [facility] Dept. 1) [address]  [place] 2) [address] [place] 3)  [address], [place] [phone]  [phone]   [facility] Child Abuse Hotline (  336) L7645479 or (336) (703)[phone] (After Hours)

## 2015-06-25 NOTE — ED Provider Notes (Signed)
CSN: [id number]     Arrival date & time 06/25/15  1403 History  By signing my name below, I, [name], attest that this documentation has been prepared under the direction and in the presence of Bernerd Limbo, [place]. Electronically Signed: [name] ED Scribe. 06/25/2015. 4:54 PM.    Chief Complaint  Patient presents with  . Back Pain   The history is provided by the patient. No language interpreter was used.    HPI Comments: Jimmy Jordan is a 29 y.o. male with a PMH significant for DM and gout who presents to the Emergency Department complaining of continued moderate low back pain, which started 1 month ago. He reports his pain is exacerbated by sitting up straight. He has tried valium and naproxen without significant symptom relief. He denies history of malignancy, IVDU, anticoagulant use. He denies bowel/bladder incontinence, saddle anesthesia, abdominal pain, N/V/D/C, CP, SOB, vision changes, headaches, lightheadedness, dizziness, weakness, numbness, paresthesia.  Of note, the patient was seen in the ED 09/28 for same symptoms, and was prescribed valium and naproxen.  Past Medical History  Diagnosis Date  . Bronchitis   . Gout   . Gout   . Diabetes mellitus without complication ([place])    History reviewed. No pertinent past surgical history. Family History  Problem Relation Age of Onset  . Hypertension Mother   . Diabetes Father    Social History  Substance Use Topics  . Smoking status: Current Every Day Smoker -- 0.50 packs/day    Types: Cigarettes  . Smokeless tobacco: None  . Alcohol Use: No     Comment: occasional     Review of Systems  Constitutional: Negative for chills and fatigue.  Eyes: Negative for visual disturbance.  Respiratory: Negative for shortness of breath.   Cardiovascular: Negative for chest pain.  Gastrointestinal: Negative for nausea, vomiting, abdominal pain, diarrhea and constipation.  Genitourinary: Negative for dysuria and urgency.   Musculoskeletal: Positive for back pain. Negative for myalgias, arthralgias, neck pain and neck stiffness.  Neurological: Negative for dizziness, syncope, weakness, light-headedness, numbness and headaches.  All other systems reviewed and are negative.     Allergies  Penicillins  Home Medications   Prior to Admission medications   Medication Sig Start Date End Date Taking? Authorizing Provider  albuterol (PROVENTIL HFA;VENTOLIN HFA) 108 (90 BASE) MCG/ACT inhaler Inhale 1-2 puffs into the lungs every 6 (six) hours as needed for wheezing or shortness of breath. 08/21/14   Courtney Forcucci, PA-C  azithromycin (ZITHROMAX Z-PAK) 250 MG tablet Take 1 tablet (250 mg total) by mouth daily. Patient not taking: Reported on 06/13/2015 05/21/15   [name], PA-C  diazepam (VALIUM) 5 MG tablet Take 1 tablet (5 mg total) by mouth 2 (two) times daily. 06/13/15   Nona Dell, PA-C  fluticasone [facility]) 50 MCG/ACT nasal spray Place 2 sprays into both nostrils daily. Patient not taking: Reported on 06/13/2015 08/21/14   Loma Sousa Forcucci, PA-C  glucose monitoring kit (FREESTYLE) monitoring kit 1 each by Does not apply route as needed for other. Patient not taking: Reported on 06/13/2015 11/17/14   Kelvin Cellar, MD  HYDROcodone-acetaminophen (NORCO/VICODIN) 5-325 MG tablet Take 2 tablets by mouth every 4 (four) hours as needed. 06/25/15   Marella Chimes, PA-C  ibuprofen (ADVIL,MOTRIN) 800 MG tablet Take 1 tablet (800 mg total) by mouth 3 (three) times daily. 06/25/15   Marella Chimes, PA-C  indomethacin (INDOCIN) 50 MG capsule Take 1 capsule (50 mg total) by mouth 3 (three) times  daily as needed for mild pain or moderate pain. Patient not taking: Reported on 06/13/2015 12/23/14   Linton Flemings, MD  insulin aspart protamine- aspart (NOVOLOG MIX 70/30) (70-30) 100 UNIT/ML injection Inject 0.2 mLs (20 Units total) into the skin 2 (two) times daily with a meal. 11/17/14   Kelvin Cellar, MD   metFORMIN (GLUCOPHAGE) 500 MG tablet Take 1 tablet (500 mg total) by mouth 2 (two) times daily with a meal. Patient taking differently: Take 500 mg by mouth 3 (three) times daily.  11/20/14   Micheline Chapman, NP  methocarbamol (ROBAXIN) 500 MG tablet Take 1 tablet (500 mg total) by mouth 2 (two) times daily. 06/25/15   Marella Chimes, PA-C  naproxen (NAPROSYN) 500 MG tablet Take 1 tablet (500 mg total) by mouth 2 (two) times daily. 06/13/15   Chesley Noon Nadeau, PA-C    BP 138/88 mmHg  Pulse 78  Temp(Src) 97.8 F (36.6 C) (Oral)  Resp 16  SpO2 98% Physical Exam  Constitutional: He is oriented to person, place, and time. He appears well-developed and well-nourished. No distress.  HENT:  Head: Normocephalic and atraumatic.  Right Ear: External ear normal.  Left Ear: External ear normal.  Nose: Nose normal.  Mouth/Throat: Uvula is midline, oropharynx is clear and moist and mucous membranes are normal.  Eyes: Conjunctivae, EOM and lids are normal. Pupils are equal, round, and reactive to light. Right eye exhibits no discharge. Left eye exhibits no discharge. No scleral icterus.  Neck: Normal range of motion. Neck supple.  Cardiovascular: Normal rate, regular rhythm, normal heart sounds, intact distal pulses and normal pulses.   Pulmonary/Chest: Effort normal and breath sounds normal. No respiratory distress. He has no wheezes. He has no rales.  Abdominal: Soft. Normal appearance and bowel sounds are normal. He exhibits no distension and no mass. There is no tenderness. There is no rigidity, no rebound and no guarding.  Musculoskeletal: Normal range of motion. He exhibits tenderness. He exhibits no edema.  Mild tenderness to palpation of lumbar spine and paraspinal muscles. No palpable step-off or deformity.  Neurological: He is alert and oriented to person, place, and time. He has normal strength and normal reflexes. No cranial nerve deficit or sensory deficit. Gait normal.   Patient ambulates without difficulty.  Skin: Skin is warm, dry and intact. No rash noted. He is not diaphoretic. No erythema. No pallor.  Psychiatric: He has a normal mood and affect. His speech is normal and behavior is normal. Judgment and thought content normal.  Nursing note and vitals reviewed.   ED Course  Procedures (including critical care time)  DIAGNOSTIC STUDIES: Oxygen Saturation is 100% on RA, normal by my interpretation.    COORDINATION OF CARE:  4:48 PM Discussed treatment plan with patient at bedside.  Patient acknowledges and agrees with plan.    Labs Review Labs Reviewed - No data to display  Imaging Review Dg Lumbar Spine Complete  06/25/2015   CLINICAL DATA:  Increasing subacute lumbar pain for 1 month. No known injury.  EXAM: LUMBAR SPINE - COMPLETE 4+ VIEW  COMPARISON:  None.  FINDINGS: Five non rib-bearing lumbar type vertebra are identified in normal alignment.  There is no evidence of acute fracture or subluxation.  The disc spaces are maintained.  Bilateral L3 pars defects are identified.  No focal bony lesions are present.  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  No other significant abnormalities.   Electronically Signed   By: Cleatis Polka.D.  On: 06/25/2015 17:19   I have personally reviewed and evaluated these images and lab results as part of my medical decision-making.   EKG Interpretation None      MDM   Final diagnoses:  Back pain, unspecified location    29 year old male presents with low back pain, which he states has been present over the last month. 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 He denies additional injury. He denies history of malignancy, IV drug use, anticoagulant use, bowel or bladder incontinence, saddle anesthesia, weakness, numbness, paresthesia.  Patient is afebrile. Vital signs stable. Heart regular rate and rhythm. Lungs clear to auscultation bilaterally. Abdomen  soft, nontender, nondistended. No lower extremity edema. Mild tenderness to palpation of lumbar spine and paraspinal muscles. No palpable step-off or deformity. Strength and sensation intact. Normal neuro exam with no focal deficit.  Pain treated in the ED. Imaging of lumbar spine demonstrates bilateral L3 pars defect without spondylolisthesis. No evidence of acute fracture or subluxation. No focal bony lesions.  Will give short course of pain medicine, ibuprofen, and robaxin for symptoms. Patient to follow-up with PCP. Spoke with patient regarding importance of follow-up, who agrees to plan. Return precautions discussed at length.  BP 138/88 mmHg  Pulse 78  Temp(Src) 97.8 F (36.6 C) (Oral)  Resp 16  SpO2 98%  I personally performed the services described in this documentation, which was scribed in my presence. The recorded information has been reviewed and is accurate.   Marella Chimes, PA-C 06/25/15 1811  Charlesetta Shanks, MD 06/28/15 1401

## 2015-07-06 ENCOUNTER — Encounter (HOSPITAL_COMMUNITY): Payer: Self-pay | Admitting: Emergency Medicine

## 2015-07-06 ENCOUNTER — Observation Stay (HOSPITAL_COMMUNITY)
Admission: EM | Admit: 2015-07-06 | Discharge: 2015-07-07 | Disposition: A | Discharge status: Home / Self Care | Payer: Self-pay | Attending: Internal Medicine | Admitting: Internal Medicine

## 2015-07-06 DIAGNOSIS — N179 Acute kidney failure, unspecified: Secondary | ICD-10-CM | POA: Diagnosis present

## 2015-07-06 DIAGNOSIS — M109 Gout, unspecified: Secondary | ICD-10-CM | POA: Insufficient documentation

## 2015-07-06 DIAGNOSIS — E1169 Type 2 diabetes mellitus with other specified complication: Secondary | ICD-10-CM | POA: Insufficient documentation

## 2015-07-06 DIAGNOSIS — M4800 Spinal stenosis, site unspecified: Secondary | ICD-10-CM | POA: Insufficient documentation

## 2015-07-06 DIAGNOSIS — Z79899 Other long term (current) drug therapy: Secondary | ICD-10-CM | POA: Insufficient documentation

## 2015-07-06 DIAGNOSIS — E11311 Type 2 diabetes mellitus with unspecified diabetic retinopathy with macular edema: Secondary | ICD-10-CM

## 2015-07-06 DIAGNOSIS — IMO0002 Reserved for concepts with insufficient information to code with codable children: Secondary | ICD-10-CM | POA: Diagnosis present

## 2015-07-06 DIAGNOSIS — E1165 Type 2 diabetes mellitus with hyperglycemia: Secondary | ICD-10-CM

## 2015-07-06 DIAGNOSIS — F17210 Nicotine dependence, cigarettes, uncomplicated: Secondary | ICD-10-CM | POA: Insufficient documentation

## 2015-07-06 DIAGNOSIS — R739 Hyperglycemia, unspecified: Secondary | ICD-10-CM

## 2015-07-06 DIAGNOSIS — E669 Obesity, unspecified: Secondary | ICD-10-CM | POA: Diagnosis present

## 2015-07-06 DIAGNOSIS — E871 Hypo-osmolality and hyponatremia: Secondary | ICD-10-CM | POA: Diagnosis present

## 2015-07-06 DIAGNOSIS — Z6834 Body mass index (BMI) 34.0-34.9, adult: Secondary | ICD-10-CM | POA: Insufficient documentation

## 2015-07-06 DIAGNOSIS — Z794 Long term (current) use of insulin: Secondary | ICD-10-CM | POA: Insufficient documentation

## 2015-07-06 DIAGNOSIS — E1100 Type 2 diabetes mellitus with hyperosmolarity without nonketotic hyperglycemic-hyperosmolar coma (NKHHC): Principal | ICD-10-CM | POA: Diagnosis present

## 2015-07-06 DIAGNOSIS — N178 Other acute kidney failure: Secondary | ICD-10-CM

## 2015-07-06 DIAGNOSIS — Z7951 Long term (current) use of inhaled steroids: Secondary | ICD-10-CM | POA: Insufficient documentation

## 2015-07-06 DIAGNOSIS — Z9114 Patient's other noncompliance with medication regimen: Secondary | ICD-10-CM | POA: Insufficient documentation

## 2015-07-06 DIAGNOSIS — I10 Essential (primary) hypertension: Secondary | ICD-10-CM | POA: Diagnosis present

## 2015-07-06 DIAGNOSIS — E1139 Type 2 diabetes mellitus with other diabetic ophthalmic complication: Secondary | ICD-10-CM | POA: Diagnosis present

## 2015-07-06 LAB — BASIC METABOLIC PANEL
ANION GAP: 8 (ref 5–15)
ANION GAP: 9 (ref 5–15)
Anion gap: 13 (ref 5–15)
BUN: 19 mg/dL (ref 6–20)
BUN: 16 mg/dL (ref 6–20)
BUN: 15 mg/dL (ref 6–20)
CALCIUM: 8.8 mg/dL — AB (ref 8.9–10.3)
CALCIUM: 8.8 mg/dL — AB (ref 8.9–10.3)
CO2: 21 mmol/L — ABNORMAL LOW (ref 22–32)
CO2: 21 mmol/L — AB (ref 22–32)
CO2: 21 mmol/L — AB (ref 22–32)
CREATININE: 1.23 mg/dL (ref 0.61–1.24)
CREATININE: 1.22 mg/dL (ref 0.61–1.24)
Calcium: 8.9 mg/dL (ref 8.9–10.3)
Chloride: 91 mmol/L — ABNORMAL LOW (ref 101–111)
Chloride: 105 mmol/L (ref 101–111)
Chloride: 106 mmol/L (ref 101–111)
Creatinine, Ser: 1.48 mg/dL — ABNORMAL HIGH (ref 0.61–1.24)
GFR calc Af Amer: >60 mL/min (ref >60)
GFR calc non Af Amer: >60 mL/min (ref >60)
GFR calc non Af Amer: >60 mL/min (ref >60)
GLUCOSE: 785 mg/dL — AB (ref 65–99)
GLUCOSE: 330 mg/dL — AB (ref 65–99)
GLUCOSE: 271 mg/dL — AB (ref 65–99)
Potassium: 4.9 mmol/L (ref 3.5–5.1)
Potassium: 4.2 mmol/L (ref 3.5–5.1)
Potassium: 4 mmol/L (ref 3.5–5.1)
SODIUM: 125 mmol/L — AB (ref 135–145)
Sodium: 134 mmol/L — ABNORMAL LOW (ref 135–145)
Sodium: 136 mmol/L (ref 135–145)

## 2015-07-06 LAB — CBC
HCT: 41 % (ref 39.0–52.0)
Hemoglobin: 14.9 g/dL (ref 13.0–17.0)
MCH: 29.7 pg (ref 26.0–34.0)
MCHC: 36.3 g/dL — AB (ref 30.0–36.0)
MCV: 81.7 fL (ref 78.0–100.0)
PLATELETS: 247 10*3/uL (ref 150–400)
RBC: 5.02 MIL/uL (ref 4.22–5.81)
RDW: 13.1 % (ref 11.5–15.5)
WBC: 7.9 10*3/uL (ref 4.0–10.5)

## 2015-07-06 LAB — URINALYSIS, ROUTINE W REFLEX MICROSCOPIC
BILIRUBIN URINE: NEGATIVE
Glucose, UA: >1000 mg/dL — AB
HGB URINE DIPSTICK: NEGATIVE
KETONES UR: NEGATIVE mg/dL
Leukocytes, UA: NEGATIVE
Nitrite: NEGATIVE
Protein, ur: NEGATIVE mg/dL
Specific Gravity, Urine: 1.026 (ref 1.005–1.030)
Urobilinogen, UA: 0.2 mg/dL (ref 0.0–1.0)
pH: 5.5 (ref 5.0–8.0)

## 2015-07-06 LAB — RAPID URINE DRUG SCREEN, HOSP PERFORMED
Amphetamines: NOT DETECTED
BARBITURATES: NOT DETECTED
Benzodiazepines: NOT DETECTED
COCAINE: NOT DETECTED
Opiates: NOT DETECTED
Tetrahydrocannabinol: NOT DETECTED

## 2015-07-06 LAB — I-STAT CHEM 8, ED
BUN: 23 mg/dL — ABNORMAL HIGH (ref 6–20)
CHLORIDE: 100 mmol/L — AB (ref 101–111)
CREATININE: 1.3 mg/dL — AB (ref 0.61–1.24)
Calcium, Ion: 1.12 mmol/L (ref 1.12–1.23)
HCT: 43 % (ref 39.0–52.0)
HEMOGLOBIN: 14.6 g/dL (ref 13.0–17.0)
POTASSIUM: 4.6 mmol/L (ref 3.5–5.1)
SODIUM: 131 mmol/L — AB (ref 135–145)
TCO2: 21 mmol/L (ref 0–100)

## 2015-07-06 LAB — MRSA PCR SCREENING: MRSA BY PCR: NEGATIVE

## 2015-07-06 LAB — CBG MONITORING, ED
Glucose-Capillary: >600 mg/dL (ref 65–99)
Glucose-Capillary: >600 mg/dL (ref 65–99)
Glucose-Capillary: >600 mg/dL (ref 65–99)
Glucose-Capillary: 487 mg/dL — ABNORMAL HIGH (ref 65–99)

## 2015-07-06 LAB — GLUCOSE, CAPILLARY
GLUCOSE-CAPILLARY: 366 mg/dL — AB (ref 65–99)
GLUCOSE-CAPILLARY: 343 mg/dL — AB (ref 65–99)
GLUCOSE-CAPILLARY: 261 mg/dL — AB (ref 65–99)
Glucose-Capillary: 332 mg/dL — ABNORMAL HIGH (ref 65–99)

## 2015-07-06 LAB — URINE MICROSCOPIC-ADD ON: Urine-Other: NONE SEEN

## 2015-07-06 MED ORDER — POTASSIUM CHLORIDE 10 MEQ/100ML IV SOLN
10.0000 meq | INTRAVENOUS | Status: AC
  Administered 2015-07-06 (×2): 10 meq via INTRAVENOUS
  Filled 2015-07-06 (×5): qty 100

## 2015-07-06 MED ORDER — INSULIN REGULAR BOLUS VIA INFUSION
0.0000 [IU] | Freq: Three times a day (TID) | INTRAVENOUS | Status: DC
  Administered 2015-07-07: 5.1 [IU] via INTRAVENOUS
  Filled 2015-07-06: qty 10

## 2015-07-06 MED ORDER — DEXTROSE-NACL 5-0.45 % IV SOLN: INTRAVENOUS | Status: DC

## 2015-07-06 MED ORDER — CLOTRIMAZOLE 1 % EX CREA
TOPICAL_CREAM | Freq: Two times a day (BID) | CUTANEOUS | Status: DC
  Filled 2015-07-06: qty 15

## 2015-07-06 MED ORDER — DEXTROSE 50 % IV SOLN: 25.0000 mL | INTRAVENOUS | Status: DC | PRN

## 2015-07-06 MED ORDER — SODIUM CHLORIDE 0.9 % IV SOLN
INTRAVENOUS | Status: DC
  Administered 2015-07-06: 5.4 [IU]/h via INTRAVENOUS
  Administered 2015-07-06: 8.5 [IU]/h via INTRAVENOUS
  Filled 2015-07-06: qty 2.5

## 2015-07-06 MED ORDER — HYDRALAZINE HCL 20 MG/ML IJ SOLN: 5.0000 mg | INTRAMUSCULAR | Status: DC | PRN

## 2015-07-06 MED ORDER — INSULIN REGULAR HUMAN 100 UNIT/ML IJ SOLN
INTRAMUSCULAR | Status: DC
  Administered 2015-07-06: 4 [IU]/h via INTRAVENOUS
  Administered 2015-07-06: 5.4 [IU]/h via INTRAVENOUS
  Filled 2015-07-06: qty 2.5

## 2015-07-06 MED ORDER — SODIUM CHLORIDE 0.9 % IV SOLN
INTRAVENOUS | Status: DC
  Administered 2015-07-06: 19:00:00 via INTRAVENOUS

## 2015-07-06 MED ORDER — SODIUM CHLORIDE 0.9 % IV BOLUS (SEPSIS)
1000.0000 mL | Freq: Once | INTRAVENOUS | Status: AC
  Administered 2015-07-06: 1000 mL via INTRAVENOUS

## 2015-07-06 MED ORDER — DEXTROSE-NACL 5-0.45 % IV SOLN
INTRAVENOUS | Status: DC
  Administered 2015-07-07: 1000 mL via INTRAVENOUS

## 2015-07-06 MED ORDER — ENOXAPARIN SODIUM 60 MG/0.6ML ~~LOC~~ SOLN
0.5000 mg/kg | SUBCUTANEOUS | Status: DC
  Administered 2015-07-06: 60 mg via SUBCUTANEOUS
  Filled 2015-07-06 (×2): qty 0.6

## 2015-07-06 NOTE — ED Provider Notes (Signed)
CSN: [id number]     Arrival date & time 07/06/15  1410 History   First MD Initiated Contact with Patient 07/06/15 1516     Chief Complaint  Patient presents with  . Hyperglycemia     (Consider location/radiation/quality/duration/timing/severity/associated sxs/prior Treatment) HPI   Patient with type 2 diabetes presents with uncontrolled blood sugars, out of his medications for approximately 1 month.  Has had two weeks of blurry vision, feeling dehydrated, polyuria, polydipsia, fatigue, mild SOB. Gradually worsening.  Lost contact with his PCP [facility] and therefore hasn't had access to his metformin and insulin.  Has been drinking approximately 3 gallons of water daily due to thirst.  Has been checking blood sugars, have been 200s-300s until very recently.  Denies any fevers, recent illness.    Past Medical History  Diagnosis Date  . Bronchitis   . Gout   . Gout   . Diabetes mellitus without complication ([place])   . Spinal stenosis    No past surgical history on file. Family History  Problem Relation Age of Onset  . Hypertension Mother   . Diabetes Father    Social History  Substance Use Topics  . Smoking status: Current Every Day Smoker -- 0.50 packs/day    Types: Cigarettes  . Smokeless tobacco: None  . Alcohol Use: No     Comment: occasional    Review of Systems  All other systems reviewed and are negative.     Allergies  Penicillins  Home Medications   Prior to Admission medications   Medication Sig Start Date End Date Taking? Authorizing Provider  albuterol (PROVENTIL HFA;VENTOLIN HFA) 108 (90 BASE) MCG/ACT inhaler Inhale 1-2 puffs into the lungs every 6 (six) hours as needed for wheezing or shortness of breath. 08/21/14  Yes Courtney Forcucci, PA-C  diazepam (VALIUM) 5 MG tablet Take 1 tablet (5 mg total) by mouth 2 (two) times daily. 06/13/15  Yes Chesley Noon Nadeau, PA-C  fluticasone [facility]) 50 MCG/ACT nasal spray Place 2 sprays into both  nostrils daily. Patient taking differently: Place 2 sprays into both nostrils daily as needed for allergies.  08/21/14  Yes Courtney Forcucci, PA-C  glucose monitoring kit (FREESTYLE) monitoring kit 1 each by Does not apply route as needed for other. 11/17/14  Yes Kelvin Cellar, MD  HYDROcodone-acetaminophen (NORCO/VICODIN) 5-325 MG tablet Take 2 tablets by mouth every 4 (four) hours as needed. 06/25/15  Yes Marella Chimes, PA-C  methocarbamol (ROBAXIN) 500 MG tablet Take 1 tablet (500 mg total) by mouth 2 (two) times daily. Patient taking differently: Take 500 mg by mouth every 6 (six) hours as needed for muscle spasms.  06/25/15  Yes Marella Chimes, PA-C  naproxen (NAPROSYN) 500 MG tablet Take 1 tablet (500 mg total) by mouth 2 (two) times daily. Patient taking differently: Take 500 mg by mouth 2 (two) times daily as needed for moderate pain.  06/13/15  Yes Chesley Noon Nadeau, PA-C  azithromycin (ZITHROMAX Z-PAK) 250 MG tablet Take 1 tablet (250 mg total) by mouth daily. Patient not taking: Reported on 06/13/2015 05/21/15   [name], PA-C  ibuprofen (ADVIL,MOTRIN) 800 MG tablet Take 1 tablet (800 mg total) by mouth 3 (three) times daily. Patient not taking: Reported on 07/06/2015 06/25/15   Marella Chimes, PA-C  indomethacin (INDOCIN) 50 MG capsule Take 1 capsule (50 mg total) by mouth 3 (three) times daily as needed for mild pain or moderate pain. Patient not taking: Reported on 06/13/2015 12/23/14   Linton Flemings, MD  insulin aspart protamine- aspart (NOVOLOG MIX 70/30) (70-30) 100 UNIT/ML injection Inject 0.2 mLs (20 Units total) into the skin 2 (two) times daily with a meal. Patient not taking: Reported on 07/06/2015 11/17/14   Kelvin Cellar, MD  metFORMIN (GLUCOPHAGE) 500 MG tablet Take 1 tablet (500 mg total) by mouth 2 (two) times daily with a meal. Patient not taking: Reported on 07/06/2015 11/20/14   Micheline Chapman, NP   BP 155/98 mmHg  Pulse 92  Temp(Src) 98.5 F  (36.9 C) (Oral)  Resp 16  SpO2 98% Physical Exam  Constitutional: He appears well-developed and well-nourished. No distress.  HENT:  Head: Normocephalic and atraumatic.  Neck: Neck supple.  Cardiovascular: Normal rate and regular rhythm.   Pulmonary/Chest: Effort normal and breath sounds normal. No respiratory distress. He has no wheezes. He has no rales.  Abdominal: Soft. He exhibits no distension and no mass. There is no tenderness. There is no rebound and no guarding.  Neurological: He is alert. He exhibits normal muscle tone.  Skin: He is not diaphoretic.  Nursing note and vitals reviewed.   ED Course  Procedures (including critical care time) Labs Review Labs Reviewed  CBC - Abnormal; Notable for the following:    MCHC 36.3 (*)    All other components within normal limits  URINALYSIS, ROUTINE W REFLEX MICROSCOPIC (NOT AT [employer]) - Abnormal; Notable for the following:    Glucose, UA >1000 (*)    All other components within normal limits  CBG MONITORING, ED - Abnormal; Notable for the following:    Glucose-Capillary >600 (*)    All other components within normal limits  CBG MONITORING, ED - Abnormal; Notable for the following:    Glucose-Capillary >600 (*)    All other components within normal limits  CBG MONITORING, ED - Abnormal; Notable for the following:    Glucose-Capillary >600 (*)    All other components within normal limits  I-STAT CHEM 8, ED - Abnormal; Notable for the following:    Sodium 131 (*)    Chloride 100 (*)    BUN 23 (*)    Creatinine, Ser 1.30 (*)    Glucose, Bld >700 (*)    All other components within normal limits  CBG MONITORING, ED - Abnormal; Notable for the following:    Glucose-Capillary >600 (*)    All other components within normal limits  URINE MICROSCOPIC-ADD ON  BASIC METABOLIC PANEL    Imaging Review No results found. I have personally reviewed and evaluated these images and lab results as part of my medical decision-making.    EKG Interpretation None       4:32 PM I have called the lab regarding the delay in BMP results.  Apparently the sample was lipemic and had to be sent to [facility] to be processed on specialized equipment.  Anticipate another hour prior to result.  Have added Chem 8 (i-stat) to attempt to get information regarding actual blood glucose and electrolytes sooner.    5:14 PM I spoke with Dr Doyle Askew who accepts this patient for admission.   MDM   Final diagnoses:  Hyperglycemia    Afebrile nontoxic patient with uncontrolled blood glucose due to being out of his home medications x 1 month.  Lost touch with PCP [facility], states they had the wrong address for him and he needs to give them his 60 - therefore hasn't been able to get his medications. Blood glucose greater than 700, does not appear to be DKA.  i-stat  chem 8 resulted but BMP still pending at time of admission.  Pt receiving NS boluses, starting 3rd liter, glucose stabilizer initiated.  Admitted to [facility].     Clayton Bibles, PA-C 07/06/15 1717  Dorie Rank, MD 07/06/15 [phone]

## 2015-07-06 NOTE — ED Notes (Signed)
Per pt, blurry vision, frequent urinary, and fatigue have been occuring for past 2 weeks; pt reports this feels similar to an incident of hyperglycemia in March
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
## 2015-07-06 NOTE — ED Notes (Signed)
Pt given water to drink. 

## 2015-07-06 NOTE — ED Notes (Signed)
PT requested both side rails be raised on stretcher

## 2015-07-06 NOTE — Progress Notes (Addendum)
EDCM spoke to patient at bedside.  Per chart review, patient is diabetic and has not taken his insulin ion 2-3 weeks.  Patient stated, "I ran out."  Patient reports he "[facility]" [facility].  [facility]  Patient reports he has 2 glucometers, one that her bought and [facility]  Patient reports he will be needing an RX for test strips and lancets on discharge.  EDCM discussed the importance of follow up with pcp.  Also discussed the detrimental effects of diabetes if not taken care of properly.  Patient is employed.  Patient thankful for services.  No further EDCM needs at this time.

## 2015-07-06 NOTE — H&P (Addendum)
[facility] History and Physical  Jimmy Jordan PXT:[id number] DOB: May 30, 1986 DOA: 07/06/2015  Referring physician: ED PA, Raquel Sarna  [facility]   Chief Complaint: fatigue  HPI:  Pt is 29 yo male with known DM on insulin, presented to [facility] ED with main concern of 1- 2 weeks duration of progressively worsening fatigue, polyuria and polydipsia, dry mouth. Pt explains he has not taken insulin in the past few weeks, he ran out of the medication and did not have any refills. Pt denies chest pain or shortness of breath, no abd concerns other than nausea. Pt also denies fevers, chills, no sick contacts or exposures.   In Ed, pt was hemodynamically stable, VSS, blood work notable for CBG > 700. TRh asked to admit for further evaluation. Due to insulin drip requirement, pt admitted to SDU.  Assessment and Plan:  Principal Problem:   Diabetic hyperosmolar non-ketotic state ([place]) - admit to SDU - place on insulin - BMP Q4 hours for 4 occurences - IVF per protocol - transition to home regimen insulin in next 12 - 24 hours  Active Problems:   DM (diabetes mellitus) type II uncontrolled with eye manifestation ([place]) - last A1C was 11 in march 2016 - will repeat A1C this admission - diabetic educator consulted     ARF (acute renal failure) ([place]) - pre renal in etiology in the setting of the principal problem - continue with IVF and repeat BMP as noted above     Hyponatremia - also pre renal from hyperglycemia - monitor    Obesity ([place]) - Body mass index is 34.83 kg/(m^2).    Hypertension, accelerated - place on Hydralazine as needed for now    DVT prophylaxis - Lovenox SQ    Radiological Exams on Admission: No results found.   Code Status: Full Family Communication: Pt at bedside Disposition Plan: Admit for further evaluation    Mart Piggs [facility] [phone]   Review of Systems:  Constitutional: Negative for fever, chills. Negative for diaphoresis.   HENT: Negative for hearing loss, ear pain, nosebleeds, congestion, sore throat, neck pain, tinnitus and ear discharge.   Eyes: Negative for blurred vision, double vision, photophobia, pain, discharge and redness.  Respiratory: Negative for cough, hemoptysis, sputum production, shortness of breath, wheezing and stridor.   Cardiovascular: Negative for chest pain, palpitations, orthopnea, claudication and leg swelling.  Gastrointestinal:Negative for heartburn, constipation, blood in stool and melena.  Genitourinary: Negative for dysuria, hematuria and flank pain.  Musculoskeletal: Negative for myalgias, back pain, joint pain and falls.  Skin: Negative for itching and rash.  Neurological: Negative for dizziness and weakness. Endo/Heme/Allergies: Negative for environmental allergies and polydipsia. Does not bruise/bleed easily.  Psychiatric/Behavioral: Negative for suicidal ideas. The patient is not nervous/anxious.      Past Medical History  Diagnosis Date  . Bronchitis   . Gout   . Gout   . Diabetes mellitus without complication ([place])   . Spinal stenosis    Social History:  reports that he has been smoking Cigarettes.  He has been smoking about 0.50 packs per day. He does not have any smokeless tobacco history on file. He reports that he does not drink alcohol or use illicit drugs.  Allergies  Allergen Reactions  . Penicillins Other (See Comments)    Childhood Has patient had a PCN reaction causing immediate rash, facial/tongue/throat swelling, SOB or lightheadedness with hypotension: Yes Has patient had a PCN reaction causing severe rash involving mucus membranes or skin necrosis: No  Has patient had a PCN reaction that required hospitalization- unsure Has patient had a PCN reaction occurring within the last 10 years: no, childhood allergy If all of the above answers are "NO", then may proceed with Cephalosporin use.     Family History  Problem Relation Age of Onset  .  Hypertension Mother   . Diabetes Father     Prior to Admission medications   Medication Sig Start Date End Date Taking? Authorizing Provider  albuterol (PROVENTIL HFA;VENTOLIN HFA) 108 (90 BASE) MCG/ACT inhaler Inhale 1-2 puffs into the lungs every 6 (six) hours as needed for wheezing or shortness of breath. 08/21/14  Yes Courtney Forcucci, PA-C  diazepam (VALIUM) 5 MG tablet Take 1 tablet (5 mg total) by mouth 2 (two) times daily. 06/13/15  Yes Chesley Noon Nadeau, PA-C  fluticasone [facility]) 50 MCG/ACT nasal spray Place 2 sprays into both nostrils daily. Patient taking differently: Place 2 sprays into both nostrils daily as needed for allergies.  08/21/14  Yes Courtney Forcucci, PA-C  glucose monitoring kit (FREESTYLE) monitoring kit 1 each by Does not apply route as needed for other. 11/17/14  Yes Kelvin Cellar, MD  HYDROcodone-acetaminophen (NORCO/VICODIN) 5-325 MG tablet Take 2 tablets by mouth every 4 (four) hours as needed. 06/25/15  Yes Marella Chimes, PA-C  methocarbamol (ROBAXIN) 500 MG tablet Take 1 tablet (500 mg total) by mouth 2 (two) times daily. Patient taking differently: Take 500 mg by mouth every 6 (six) hours as needed for muscle spasms.  06/25/15  Yes Marella Chimes, PA-C  naproxen (NAPROSYN) 500 MG tablet Take 1 tablet (500 mg total) by mouth 2 (two) times daily. Patient taking differently: Take 500 mg by mouth 2 (two) times daily as needed for moderate pain.  06/13/15  Yes Chesley Noon Nadeau, PA-C  azithromycin (ZITHROMAX Z-PAK) 250 MG tablet Take 1 tablet (250 mg total) by mouth daily. Patient not taking: Reported on 06/13/2015 05/21/15   [name], PA-C  ibuprofen (ADVIL,MOTRIN) 800 MG tablet Take 1 tablet (800 mg total) by mouth 3 (three) times daily. Patient not taking: Reported on 07/06/2015 06/25/15   Marella Chimes, PA-C  indomethacin (INDOCIN) 50 MG capsule Take 1 capsule (50 mg total) by mouth 3 (three) times daily as needed for mild pain or  moderate pain. Patient not taking: Reported on 06/13/2015 12/23/14   Linton Flemings, MD  insulin aspart protamine- aspart (NOVOLOG MIX 70/30) (70-30) 100 UNIT/ML injection Inject 0.2 mLs (20 Units total) into the skin 2 (two) times daily with a meal. Patient not taking: Reported on 07/06/2015 11/17/14   Kelvin Cellar, MD  metFORMIN (GLUCOPHAGE) 500 MG tablet Take 1 tablet (500 mg total) by mouth 2 (two) times daily with a meal. Patient not taking: Reported on 07/06/2015 11/20/14   Micheline Chapman, NP    Physical Exam: Filed Vitals:   07/06/15 1656 07/06/15 1735 07/06/15 1825 07/06/15 1827  BP: 150/83 142/85 163/104 153/90  Pulse: 85 81 85   Temp:   98.1 F (36.7 C)   TempSrc:   Oral   Resp: _0 Height:   _1  (1.88 m)   Weight:   123.1 kg (271 lb 6.2 oz)   SpO2: 96% 98% 97%     Physical Exam  Constitutional: Appears well-developed and well-nourished. No distress.  HENT: Normocephalic. External right and left ear normal. Dry MM Eyes: Conjunctivae and EOM are normal. PERRLA, no scleral icterus.  Neck: Normal ROM. Neck supple. No JVD. No tracheal  deviation. No thyromegaly.  CVS: RRR, S1/S2 +, no murmurs, no gallops, no carotid bruit.  Pulmonary: Effort and breath sounds normal, no stridor, rhonchi, wheezes, rales.  Abdominal: Soft. BS +,  no distension, tenderness, rebound or guarding.  Musculoskeletal: Normal range of motion.  Lymphadenopathy: No lymphadenopathy noted, cervical, inguinal. Neuro: Alert. Normal reflexes, muscle tone coordination. No cranial nerve deficit. Skin: Skin is warm and dry. No rash noted. Not diaphoretic. No erythema. No pallor.  Psychiatric: Normal mood and affect. Behavior, judgment, thought content normal.   Labs on Admission:  Basic Metabolic Panel:  Recent Labs Lab 07/06/15 1456 07/06/15 1648  NA 125* 131*  K 4.9 4.6  CL 91* 100*  CO2 21*  --   GLUCOSE 785* >700*  BUN 19 23*  CREATININE 1.48* 1.30*  CALCIUM 8.9  --    CBC:  Recent  Labs Lab 07/06/15 1456 07/06/15 1648  WBC 7.9  --   HGB 14.9 14.6  HCT 41.0 43.0  MCV 81.7  --   PLT 247  --    CBG:  Recent Labs Lab 07/06/15 1417 07/06/15 1541 07/06/15 1629 07/06/15 1659 07/06/15 1803  GLUCAP >600* >600* >600* >600* 487*    EKG: pending    If 7PM-7AM, please contact night-coverage [email or web address] Password [email or web address] 07/06/2015, 7:00 PM

## 2015-07-06 NOTE — ED Notes (Signed)
Notified RN critical Istat chem 8 lab results

## 2015-07-06 NOTE — ED Notes (Signed)
Blood drawn from right IV line for I-Stat

## 2015-07-07 ENCOUNTER — Encounter (HOSPITAL_COMMUNITY): Payer: Self-pay | Admitting: *Deleted

## 2015-07-07 DIAGNOSIS — E669 Obesity, unspecified: Secondary | ICD-10-CM

## 2015-07-07 LAB — GLUCOSE, CAPILLARY
GLUCOSE-CAPILLARY: 129 mg/dL — AB (ref 65–99)
GLUCOSE-CAPILLARY: 147 mg/dL — AB (ref 65–99)
GLUCOSE-CAPILLARY: 150 mg/dL — AB (ref 65–99)
GLUCOSE-CAPILLARY: 182 mg/dL — AB (ref 65–99)
GLUCOSE-CAPILLARY: 241 mg/dL — AB (ref 65–99)
Glucose-Capillary: 149 mg/dL — ABNORMAL HIGH (ref 65–99)
Glucose-Capillary: 170 mg/dL — ABNORMAL HIGH (ref 65–99)
Glucose-Capillary: 181 mg/dL — ABNORMAL HIGH (ref 65–99)
Glucose-Capillary: 243 mg/dL — ABNORMAL HIGH (ref 65–99)
Glucose-Capillary: 318 mg/dL — ABNORMAL HIGH (ref 65–99)

## 2015-07-07 LAB — BASIC METABOLIC PANEL
ANION GAP: 9 (ref 5–15)
BUN: 12 mg/dL (ref 6–20)
CALCIUM: 8.8 mg/dL — AB (ref 8.9–10.3)
CO2: 22 mmol/L (ref 22–32)
Chloride: 109 mmol/L (ref 101–111)
Creatinine, Ser: 1.09 mg/dL (ref 0.61–1.24)
Glucose, Bld: 115 mg/dL — ABNORMAL HIGH (ref 65–99)
POTASSIUM: 3.6 mmol/L (ref 3.5–5.1)
Sodium: 140 mmol/L (ref 135–145)

## 2015-07-07 LAB — CBC
HEMATOCRIT: 39.6 % (ref 39.0–52.0)
Hemoglobin: 14.1 g/dL (ref 13.0–17.0)
MCH: 29.3 pg (ref 26.0–34.0)
MCHC: 35.6 g/dL (ref 30.0–36.0)
MCV: 82.3 fL (ref 78.0–100.0)
Platelets: 235 10*3/uL (ref 150–400)
RBC: 4.81 MIL/uL (ref 4.22–5.81)
RDW: 13.1 % (ref 11.5–15.5)
WBC: 6.5 10*3/uL (ref 4.0–10.5)

## 2015-07-07 MED ORDER — INSULIN ASPART PROT & ASPART (70-30 MIX) 100 UNIT/ML ~~LOC~~ SUSP
20.0000 [IU] | Freq: Two times a day (BID) | SUBCUTANEOUS | Status: DC
  Administered 2015-07-07: 20 [IU] via SUBCUTANEOUS
  Filled 2015-07-07: qty 10

## 2015-07-07 MED ORDER — FLUCONAZOLE IN SODIUM CHLORIDE 200-0.9 MG/100ML-% IV SOLN
200.0000 mg | Freq: Once | INTRAVENOUS | Status: AC
  Administered 2015-07-07: 200 mg via INTRAVENOUS
  Filled 2015-07-07: qty 100

## 2015-07-07 MED ORDER — INSULIN ASPART 100 UNIT/ML ~~LOC~~ SOLN: 0.0000 [IU] | Freq: Every day | SUBCUTANEOUS | Status: DC

## 2015-07-07 MED ORDER — ALBUTEROL SULFATE HFA 108 (90 BASE) MCG/ACT IN AERS
1.0000 | INHALATION_SPRAY | Freq: Four times a day (QID) | RESPIRATORY_TRACT | Status: AC | PRN
Start: 2015-07-07 — End: ?

## 2015-07-07 MED ORDER — HYDROCODONE-ACETAMINOPHEN 5-325 MG PO TABS: 2.0000 | ORAL_TABLET | ORAL | Status: DC | PRN

## 2015-07-07 MED ORDER — INSULIN ASPART 100 UNIT/ML ~~LOC~~ SOLN
0.0000 [IU] | Freq: Three times a day (TID) | SUBCUTANEOUS | Status: DC
  Administered 2015-07-07: 15 [IU] via SUBCUTANEOUS

## 2015-07-07 MED ORDER — INSULIN ASPART PROT & ASPART (70-30 MIX) 100 UNIT/ML ~~LOC~~ SUSP: 20.0000 [IU] | Freq: Two times a day (BID) | SUBCUTANEOUS | Status: DC

## 2015-07-07 MED ORDER — METFORMIN HCL 500 MG PO TABS: 500.0000 mg | ORAL_TABLET | Freq: Two times a day (BID) | ORAL | Status: DC

## 2015-07-07 MED ORDER — INSULIN ASPART 100 UNIT/ML ~~LOC~~ SOLN
3.0000 [IU] | Freq: Three times a day (TID) | SUBCUTANEOUS | Status: DC
  Administered 2015-07-07: 3 [IU] via SUBCUTANEOUS

## 2015-07-07 NOTE — Progress Notes (Signed)
Patient provided with complete discharge instructions and scripts. Pt with enough home insulin to make it until Monday. Monday he states: "[facility]". All questions answered. IV's removed. Patient transported downstairs via wheelchair by RN at 1220.

## 2015-07-07 NOTE — Discharge Instructions (Signed)
Blood Glucose Monitoring, Adult °Monitoring your blood glucose (also know as blood sugar) helps you to manage your diabetes. It also helps you and your health care provider monitor your diabetes and determine how well your treatment plan is working. °WHY SHOULD YOU MONITOR YOUR BLOOD GLUCOSE? °· It can help you understand how food, exercise, and medicine affect your blood glucose. °· It allows you to know what your blood glucose is at any given moment. You can quickly tell if you are having low blood glucose (hypoglycemia) or high blood glucose (hyperglycemia). °· It can help you and your health care provider know how to adjust your medicines. °· It can help you understand how to manage an illness or adjust medicine for exercise. °WHEN SHOULD YOU TEST? °Your health care provider will help you decide how often you should check your blood glucose. This may depend on the type of diabetes you have, your diabetes control, or the types of medicines you are taking. Be sure to write down all of your blood glucose readings so that this information can be reviewed with your health care provider. See below for examples of testing times that your health care provider may suggest. °Type 1 Diabetes °· Test at least 2 times per day if your diabetes is well controlled, if you are using an insulin pump, or if you perform multiple daily injections. °· If your diabetes is not well controlled or if you are sick, you may need to test more often. °· It is a good idea to also test: °¨ Before every insulin injection. °¨ Before and after exercise. °¨ Between meals and 2 hours after a meal. °¨ Occasionally between 2:00 a.m. and 3:00 a.m. °Type 2 Diabetes °· If you are taking insulin, test at least 2 times per day. However, it is best to test before every insulin injection. °· If you take medicines by mouth (orally), test 2 times a day. °· If you are on a controlled diet, test once a day. °· If your diabetes is not well controlled or if you  are sick, you may need to monitor more often. °HOW TO MONITOR YOUR BLOOD GLUCOSE °Supplies Needed °· Blood glucose meter. °· Test strips for your meter. Each meter has its own strips. You must use the strips that go with your own meter. °· A pricking needle (lancet). °· A device that holds the lancet (lancing device). °· A journal or log book to write down your results. °Procedure °· Wash your hands with soap and water. Alcohol is not preferred. °· Prick the side of your finger (not the tip) with the lancet. °· Gently milk the finger until a small drop of blood appears. °· Follow the instructions that come with your meter for inserting the test strip, applying blood to the strip, and using your blood glucose meter. °Other Areas to Get Blood for Testing °Some meters allow you to use other areas of your body (other than your finger) to test your blood. These areas are called alternative sites. The most common alternative sites are: °· The forearm. °· The thigh. °· The back area of the lower leg. °· The palm of the hand. °The blood flow in these areas is slower. Therefore, the blood glucose values you get may be delayed, and the numbers are different from what you would get from your fingers. Do not use alternative sites if you think you are having hypoglycemia. Your reading will not be accurate. Always use a finger if you are   having hypoglycemia. Also, if you cannot feel your lows (hypoglycemia unawareness), always use your fingers for your blood glucose checks. °ADDITIONAL TIPS FOR GLUCOSE MONITORING °· Do not reuse lancets. °· Always carry your supplies with you. °· All blood glucose meters have a 24-hour "hotline" number to call if you have questions or need help. °· Adjust (calibrate) your blood glucose meter with a control solution after finishing a few boxes of strips. °BLOOD GLUCOSE RECORD KEEPING °It is a good idea to keep a daily record or log of your blood glucose readings. Most glucose meters, if not all,  keep your glucose records stored in the meter. Some meters come with the ability to download your records to your home computer. Keeping a record of your blood glucose readings is especially helpful if you are wanting to look for patterns. Make notes to go along with the blood glucose readings because you might forget what happened at that exact time. Keeping good records helps you and your health care provider to work together to achieve good diabetes management.  °  °This information is not intended to replace advice given to you by your health care provider. Make sure you discuss any questions you have with your health care provider. °  °Document Released: 09/04/2003 Document Revised: 09/22/2014 Document Reviewed: 01/24/2013 °Elsevier Interactive Patient Education ©2016 [employer] ° °Hyperglycemia °Hyperglycemia occurs when the glucose (sugar) in your blood is too high. Hyperglycemia can happen for many reasons, but it most often happens to people who do not know they have diabetes or are not managing their diabetes properly.  °CAUSES  °Whether you have diabetes or not, there are other causes of hyperglycemia. Hyperglycemia can occur when you have diabetes, but it can also occur in other situations that you might not be as aware of, such as: °Diabetes °· If you have diabetes and are having problems controlling your blood glucose, hyperglycemia could occur because of some of the following reasons: °¨ Not following your meal plan. °¨ Not taking your diabetes medications or not taking it properly. °¨ Exercising less or doing less activity than you normally do. °¨ Being sick. °Pre-diabetes °· This cannot be ignored. Before people develop Type 2 diabetes, they almost always have "pre-diabetes." This is when your blood glucose levels are higher than normal, but not yet high enough to be diagnosed as diabetes. Research has shown that some long-term damage to the body, especially the heart and circulatory system, may  already be occurring during pre-diabetes. If you take action to manage your blood glucose when you have pre-diabetes, you may delay or prevent Type 2 diabetes from developing. °Stress °· If you have diabetes, you may be "diet" controlled or on oral medications or insulin to control your diabetes. However, you may find that your blood glucose is higher than usual in the hospital whether you have diabetes or not. This is often referred to as "stress hyperglycemia." Stress can elevate your blood glucose. This happens because of hormones put out by the body during times of stress. If stress has been the cause of your high blood glucose, it can be followed regularly by your caregiver. That way he/she can make sure your hyperglycemia does not continue to get worse or progress to diabetes. °Steroids °· Steroids are medications that act on the infection fighting system (immune system) to block inflammation or infection. One side effect can be a rise in blood glucose. Most people can produce enough extra insulin to allow for this rise, but   for those who cannot, steroids make blood glucose levels go even higher. It is not unusual for steroid treatments to "uncover" diabetes that is developing. It is not always possible to determine if the hyperglycemia will go away after the steroids are stopped. A special blood test called an A1c is sometimes done to determine if your blood glucose was elevated before the steroids were started. °SYMPTOMS °· Thirsty. °· Frequent urination. °· Dry mouth. °· Blurred vision. °· Tired or fatigue. °· Weakness. °· Sleepy. °· Tingling in feet or leg. °DIAGNOSIS  °Diagnosis is made by monitoring blood glucose in one or all of the following ways: °· A1c test. This is a chemical found in your blood. °· Fingerstick blood glucose monitoring. °· Laboratory results. °TREATMENT  °First, knowing the cause of the hyperglycemia is important before the hyperglycemia can be treated. Treatment may include, but is  not be limited to: °· Education. °· Change or adjustment in medications. °· Change or adjustment in meal plan. °· Treatment for an illness, infection, etc. °· More frequent blood glucose monitoring. °· Change in exercise plan. °· Decreasing or stopping steroids. °· Lifestyle changes. °HOME CARE INSTRUCTIONS  °· Test your blood glucose as directed. °· Exercise regularly. Your caregiver will give you instructions about exercise. Pre-diabetes or diabetes which comes on with stress is helped by exercising. °· Eat wholesome, balanced meals. Eat often and at regular, fixed times. Your caregiver or nutritionist will give you a meal plan to guide your sugar intake. °· Being at an ideal weight is important. If needed, losing as little as 10 to 15 pounds may help improve blood glucose levels. °SEEK MEDICAL CARE IF:  °· You have questions about medicine, activity, or diet. °· You continue to have symptoms (problems such as increased thirst, urination, or weight gain). °SEEK IMMEDIATE MEDICAL CARE IF:  °· You are vomiting or have diarrhea. °· Your breath smells fruity. °· You are breathing faster or slower. °· You are very sleepy or incoherent. °· You have numbness, tingling, or pain in your feet or hands. °· You have chest pain. °· Your symptoms get worse even though you have been following your caregiver's orders. °· If you have any other questions or concerns. °  °This information is not intended to replace advice given to you by your health care provider. Make sure you discuss any questions you have with your health care provider. °  °Document Released: 02/25/2001 Document Revised: 11/24/2011 Document Reviewed: 05/08/2015 °Elsevier Interactive Patient Education ©2016 [employer] ° °

## 2015-07-07 NOTE — Discharge Summary (Signed)
Physician Discharge Summary  Jimmy Jordan BXU:[id number] DOB: 03-13-1986 DOA: 07/06/2015  PCP: No PCP Per Patient  Admit date: 07/06/2015 Discharge date: 07/07/2015  Recommendations for Outpatient Follow-up:  1. Pt will need to follow up with PCP in 2-3 weeks post discharge 2. Please obtain BMP to evaluate electrolytes and kidney function 3. Please also check CBC to evaluate Hg and Hct levels 4. Pt advised [facility] to have provider assigned and he has verbalized understanding   Discharge Diagnoses:  Principal Problem:   Diabetic hyperosmolar non-ketotic state ([place]) Active Problems:   DM (diabetes mellitus) type II uncontrolled with eye manifestation ([place])   ARF (acute renal failure) ([place])   Hyponatremia   Hypertension, accelerated   Obesity  Discharge Condition: Stable  Diet recommendation: Heart healthy diet discussed in details    HPI:  Pt is 29 yo male with known DM on insulin, presented to [facility] ED with main concern of 1- 2 weeks duration of progressively worsening fatigue, polyuria and polydipsia, dry mouth. Pt explains he has not taken insulin in the past few weeks, he ran out of the medication and did not have any refills. Pt denies chest pain or shortness of breath, no abd concerns other than nausea. Pt also denies fevers, chills, no sick contacts or exposures.   In Ed, pt was hemodynamically stable, VSS, blood work notable for CBG > 700. TRh asked to admit for further evaluation. Due to insulin drip requirement, pt admitted to SDU.  Assessment and Plan:  Principal Problem:  Diabetic hyperosmolar non-ketotic state ([place]) secondary to medical non compliance  - placed on insulin drip and has responded well  - IVF have been provided and CBG's are now at target range - pt tolerating diet well and wants to go home - will provide scripts with refills and recommended outpatient follow up  Active Problems:  DM (diabetes  mellitus) type II uncontrolled with eye manifestation ([place]) - last A1C was 11 in march 2016 - A1C pending on discharge  - will need close outpatient follow up and pt made aware, verbalized understanding    ARF (acute renal failure) ([place]) - pre renal in etiology in the setting of the principal problem - resolved with IVF    Hyponatremia - also pre renal from hyperglycemia - resolved    Obesity ([place]) - Body mass index is 34.83 kg/(m^2).   Hypertension, accelerated - reasonably stable inpatient  - no need for outpatient antihypertensive for now   Procedures/Studies: Dg Lumbar Spine Complete  06/25/2015  CLINICAL DATA:  Increasing subacute lumbar pain for 1 month. No known injury. EXAM: LUMBAR SPINE - COMPLETE 4+ VIEW COMPARISON:  None. FINDINGS: Five non rib-bearing lumbar type vertebra are identified in normal alignment. There is no evidence of acute fracture or subluxation. The disc spaces are maintained. Bilateral L3 pars defects are identified. No focal bony lesions are present. 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 No other significant abnormalities. Electronically Signed   By: Margarette Canada M.D.   On: 06/25/2015 17:19   Discharge Exam: Filed Vitals:   07/07/15 0800  BP:   Pulse:   Temp: 98.4 F (36.9 C)  Resp:    Filed Vitals:   07/07/15 0448 07/07/15 0500 07/07/15 0600 07/07/15 0800  BP: 142/93     Pulse: 80 80 79   Temp:    98.4 F (36.9 C)  TempSrc:    Oral  Resp: 13 21  Height:      Weight:      SpO2: 96% 94% 96%     General: Pt is alert, follows commands appropriately, not in acute distress Cardiovascular: Regular rate and rhythm, S1/S2 +, no murmurs, no rubs, no gallops Respiratory: Clear to auscultation bilaterally, no wheezing, no crackles, no rhonchi Abdominal: Soft, non tender, non distended, bowel sounds +, no guarding Extremities: no edema, no cyanosis, pulses palpable bilaterally DP and PT Neuro: Grossly  nonfocal  Discharge Instructions     Medication List    STOP taking these medications        azithromycin 250 MG tablet  Commonly known as:  ZITHROMAX Z-PAK     ibuprofen 800 MG tablet  Commonly known as:  ADVIL,MOTRIN     indomethacin 50 MG capsule  Commonly known as:  INDOCIN      TAKE these medications        albuterol 108 (90 BASE) MCG/ACT inhaler  Commonly known as:  PROVENTIL HFA;VENTOLIN HFA  Inhale 1-2 puffs into the lungs every 6 (six) hours as needed for wheezing or shortness of breath.     diazepam 5 MG tablet  Commonly known as:  VALIUM  Take 1 tablet (5 mg total) by mouth 2 (two) times daily.     fluticasone 50 MCG/ACT nasal spray  Commonly known as:  FLONASE  Place 2 sprays into both nostrils daily.     glucose monitoring kit monitoring kit  1 each by Does not apply route as needed for other.     HYDROcodone-acetaminophen 5-325 MG tablet  Commonly known as:  NORCO/VICODIN  Take 2 tablets by mouth every 4 (four) hours as needed.     insulin aspart protamine- aspart (70-30) 100 UNIT/ML injection  Commonly known as:  NOVOLOG MIX 70/30  Inject 0.2 mLs (20 Units total) into the skin 2 (two) times daily with a meal.     metFORMIN 500 MG tablet  Commonly known as:  GLUCOPHAGE  Take 1 tablet (500 mg total) by mouth 2 (two) times daily with a meal.     methocarbamol 500 MG tablet  Commonly known as:  ROBAXIN  Take 1 tablet (500 mg total) by mouth 2 (two) times daily.     naproxen 500 MG tablet  Commonly known as:  NAPROSYN  Take 1 tablet (500 mg total) by mouth 2 (two) times daily.           Follow-up Information    Call Faye Ramsay, MD.   Specialty:  Internal Medicine   Why:  As needed   Contact information:   [address] [place] [address] [phone]       Call [facility].   Why:  As needed   Contact information:   [address] [place]  [address] [phone]       The results of significant diagnostics from this hospitalization (including imaging, microbiology, ancillary and laboratory) are listed below for reference.     Microbiology: Recent Results (from the past 240 hour(s))  MRSA PCR Screening     Status: None   Collection Time: 07/06/15  6:53 PM  Result Value Ref Range Status   MRSA by PCR NEGATIVE NEGATIVE Final    Comment:        The GeneXpert MRSA Assay (FDA approved for NASAL specimens only), is one component of a comprehensive MRSA colonization surveillance program. It is not intended to diagnose MRSA infection nor  to guide or monitor treatment for MRSA infections.      Labs: Basic Metabolic Panel:  Recent Labs Lab 07/06/15 1456 07/06/15 1648 07/06/15 1925 07/06/15 2304 07/07/15 0340  NA 125* 131* 134* 136 140  K 4.9 4.6 4.2 4.0 3.6  CL 91* 100* 105 106 109  CO2 21*  --  21* 21* 22  GLUCOSE 785* >700* 330* 271* 115*  BUN 19 23* $Remo'16 15 12  'nGVil$ CREATININE 1.48* 1.30* 1.23 1.22 1.09  CALCIUM 8.9  --  8.8* 8.8* 8.8*   CBC:  Recent Labs Lab 07/06/15 1456 07/06/15 1648 07/07/15 0340  WBC 7.9  --  6.5  HGB 14.9 14.6 14.1  HCT 41.0 43.0 39.6  MCV 81.7  --  82.3  PLT 247  --  235    CBG:  Recent Labs Lab 07/07/15 0357 07/07/15 0446 07/07/15 0535 07/07/15 0639 07/07/15 0731  GLUCAP 129* 150* 181* 170* 149*     SIGNED: Time coordinating discharge: 30 minutes  MAGICK-Cynthya Yam, MD  [facility] 07/07/2015, 9:41 AM Pager [phone]  If 7PM-7AM, please contact night-coverage [email or web address] Password [email or web address]

## 2015-07-09 LAB — HEMOGLOBIN A1C
HEMOGLOBIN A1C: 12.4 % — AB (ref 4.8–5.6)
MEAN PLASMA GLUCOSE: 309 mg/dL

## 2015-07-12 ENCOUNTER — Telehealth: Payer: Self-pay | Admitting: *Deleted

## 2015-07-12 ENCOUNTER — Encounter: Payer: Self-pay | Admitting: Family Medicine

## 2015-07-12 ENCOUNTER — Ambulatory Visit: Payer: Self-pay | Attending: Family Medicine | Admitting: Family Medicine

## 2015-07-12 VITALS — BP 144/95 | HR 98 | Temp 97.0°F | Resp 16 | Ht 74.0 in | Wt 269.0 lb

## 2015-07-12 DIAGNOSIS — M545 Low back pain, unspecified: Secondary | ICD-10-CM | POA: Insufficient documentation

## 2015-07-12 DIAGNOSIS — Z114 Encounter for screening for human immunodeficiency virus [HIV]: Secondary | ICD-10-CM

## 2015-07-12 DIAGNOSIS — Z794 Long term (current) use of insulin: Secondary | ICD-10-CM | POA: Insufficient documentation

## 2015-07-12 DIAGNOSIS — Z8639 Personal history of other endocrine, nutritional and metabolic disease: Secondary | ICD-10-CM | POA: Insufficient documentation

## 2015-07-12 DIAGNOSIS — I10 Essential (primary) hypertension: Secondary | ICD-10-CM | POA: Insufficient documentation

## 2015-07-12 DIAGNOSIS — Z79899 Other long term (current) drug therapy: Secondary | ICD-10-CM | POA: Insufficient documentation

## 2015-07-12 DIAGNOSIS — E11311 Type 2 diabetes mellitus with unspecified diabetic retinopathy with macular edema: Secondary | ICD-10-CM

## 2015-07-12 DIAGNOSIS — Z833 Family history of diabetes mellitus: Secondary | ICD-10-CM | POA: Insufficient documentation

## 2015-07-12 DIAGNOSIS — Z9119 Patient's noncompliance with other medical treatment and regimen: Secondary | ICD-10-CM | POA: Insufficient documentation

## 2015-07-12 DIAGNOSIS — F17210 Nicotine dependence, cigarettes, uncomplicated: Secondary | ICD-10-CM | POA: Insufficient documentation

## 2015-07-12 DIAGNOSIS — E1165 Type 2 diabetes mellitus with hyperglycemia: Secondary | ICD-10-CM | POA: Insufficient documentation

## 2015-07-12 DIAGNOSIS — Z8249 Family history of ischemic heart disease and other diseases of the circulatory system: Secondary | ICD-10-CM | POA: Insufficient documentation

## 2015-07-12 DIAGNOSIS — N178 Other acute kidney failure: Secondary | ICD-10-CM

## 2015-07-12 DIAGNOSIS — E1139 Type 2 diabetes mellitus with other diabetic ophthalmic complication: Secondary | ICD-10-CM | POA: Insufficient documentation

## 2015-07-12 LAB — BASIC METABOLIC PANEL
BUN: 14 mg/dL (ref 7–25)
CO2: 26 mmol/L (ref 20–31)
Calcium: 9.2 mg/dL (ref 8.6–10.3)
Chloride: 101 mmol/L (ref 98–110)
Creat: 1.63 mg/dL — ABNORMAL HIGH (ref 0.60–1.35)
GLUCOSE: 334 mg/dL — AB (ref 65–99)
POTASSIUM: 4.7 mmol/L (ref 3.5–5.3)
SODIUM: 132 mmol/L — AB (ref 135–146)

## 2015-07-12 LAB — POCT URINALYSIS DIPSTICK
BILIRUBIN UA: NEGATIVE
GLUCOSE UA: 500
KETONES UA: 15
LEUKOCYTES UA: NEGATIVE
NITRITE UA: NEGATIVE
Protein, UA: NEGATIVE
RBC UA: NEGATIVE
Spec Grav, UA: 1.01
Urobilinogen, UA: 0.2
pH, UA: 5.5

## 2015-07-12 LAB — GLUCOSE, POCT (MANUAL RESULT ENTRY): POC GLUCOSE: 326 mg/dL — AB (ref 70–99)

## 2015-07-12 LAB — HIV ANTIBODY (ROUTINE TESTING W REFLEX): HIV 1&2 Ab, 4th Generation: NONREACTIVE

## 2015-07-12 MED ORDER — "INSULIN SYRINGE-NEEDLE U-100 31G X 5/16"" 0.5 ML MISC": 1.0000 | Freq: Three times a day (TID) | Status: AC

## 2015-07-12 MED ORDER — METFORMIN HCL 1000 MG PO TABS: 1000.0000 mg | ORAL_TABLET | Freq: Two times a day (BID) | ORAL | Status: DC

## 2015-07-12 MED ORDER — INSULIN ASPART PROT & ASPART (70-30 MIX) 100 UNIT/ML ~~LOC~~ SUSP: 25.0000 [IU] | Freq: Two times a day (BID) | SUBCUTANEOUS | Status: DC

## 2015-07-12 MED ORDER — METHOCARBAMOL 500 MG PO TABS: 500.0000 mg | ORAL_TABLET | Freq: Three times a day (TID) | ORAL | Status: AC | PRN

## 2015-07-12 MED ORDER — SITAGLIPTIN PHOSPHATE 100 MG PO TABS: 100.0000 mg | ORAL_TABLET | Freq: Every day | ORAL | Status: AC

## 2015-07-12 MED ORDER — INSULIN ASPART PROT & ASPART (70-30 MIX) 100 UNIT/ML ~~LOC~~ SUSP
30.0000 [IU] | Freq: Two times a day (BID) | SUBCUTANEOUS | Status: DC
Start: 2015-07-12 — End: 2015-11-04

## 2015-07-12 MED ORDER — GLUCOSE BLOOD VI STRP: 1.0000 | ORAL_STRIP | Freq: Two times a day (BID) | Status: AC

## 2015-07-12 MED ORDER — NAPROXEN 500 MG PO TABS: 500.0000 mg | ORAL_TABLET | Freq: Two times a day (BID) | ORAL | Status: DC | PRN

## 2015-07-12 MED ORDER — TRUEPLUS LANCETS 28G MISC: 1.0000 | Freq: Three times a day (TID) | Status: AC

## 2015-07-12 NOTE — Telephone Encounter (Signed)
Unable to contact pt. Not accepting call at this time 

## 2015-07-12 NOTE — Assessment & Plan Note (Signed)
A: low back pain x 2 months. X-ray confirmed b/l L3 pars defect. Patient does not have evidence of spinal stenosis on imaging.  P: Patient to apply for orange card Refilled flexeril and naproxen vicodin prn, patient has Rx from hospitalization Plan for MRI if pain does not improve. Plan for PT  Home exercise provided

## 2015-07-12 NOTE — Addendum Note (Signed)
Addended by: Dessa PhiFUNCHES, Dannelle Rhymes on: 07/12/2015 05:09 PM   Modules accepted: Orders, Medications

## 2015-07-12 NOTE — Assessment & Plan Note (Signed)
A: patient HTN in setting of DM2, hx of gout, recent ARF in setting of hyperglycemia P: Repeat BMP Plan for ARB/ACE i if Cr improving

## 2015-07-12 NOTE — Assessment & Plan Note (Addendum)
A: uncontrolled recently dx diabetic. Uncontrolled due to non compliance P; Refilled med. Max metformin to 1000 mg BID Increase 70/20 to 25 U BID Low carb diet Exercise as tolerated by back, discussed low impact Patient to apply for orange card for referral to opthalmology   Screening HIV negative Unfortunately sodium is low, blood sugar high and Cr elevated Patient will have to STOP metformin completely Increase 70/30 to 30 U BID Add januvia 100 mg daily

## 2015-07-12 NOTE — Progress Notes (Signed)
Patient ID: Jimmy Jordan, male   DOB: June 08, 1986, 29 y.o.   MRN: [id number]   Subjective:  Patient ID: Jimmy Jordan, male    DOB: 11-17-1985  Age: 29 y.o. MRN: [id number]  CC: Diabetes   HPI Jimmy Jordan presents for   1. CHRONIC DIABETES dx in 11/2014. Patient hospitalized from 10/21-10/22/2016 for hyperglycemia, hyponatremia and ARF. He is now taking insulin and metformin for past 2 days.   Disease Monitoring  Blood Sugar Ranges: 150-240  Polyuria: yes, before admission    Visual problems: yes, was blurry now improving    Medication Compliance: yes, for past 2 days   Medication Side Effects  Hypoglycemia: no   Preventitive Health   Eye Exam: due   Foot Exam: done today   Diet pattern: doing low carb ate eggs and chicken today   Exercise: not yet because of his chronic low back pain   2. Low back pain: started in 04/2015. Woke up one morning with back pain. Pain is persistent. Goes from side to side. Sometimes in middle. On R side for last week. Radiating up. Pain does not radiate down. Pain now is 9/10. Sitting makes it worse. Walking, laying on back or side is better.  No fever or chills. No fecal or urinary incontinence. Muscle relaxer and narcotic pain medicine help temporarily. No PT or MRI. Has had lumbar x-ray. No family history of back pain.   06/25/2015 DG lumbar spine IMPRESSION: Bilateral L3 pars defects without spondylolisthesis.  No other significant abnormalities.  Past Medical History  Diagnosis Date  . Bronchitis   . Gout   . Diabetes mellitus without complication ([place]) 05/7947    History reviewed. No pertinent past surgical history.  Family History  Problem Relation Age of Onset  . Hypertension Mother   . Diabetes Father     Social History  Substance Use Topics  . Smoking status: Current Every Day Smoker -- 0.50 packs/day    Types: Cigarettes  . Smokeless tobacco: Not on file  . Alcohol Use: No     Comment: occasional    ROS Review  of Systems  Constitutional: Negative for fever, chills, fatigue and unexpected weight change.  Eyes: Negative for visual disturbance.  Respiratory: Negative for cough and shortness of breath.   Cardiovascular: Negative for chest pain, palpitations and leg swelling.  Gastrointestinal: Negative for nausea, vomiting, abdominal pain, diarrhea, constipation and blood in stool.  Endocrine: Negative for polydipsia, polyphagia and polyuria.  Musculoskeletal: Positive for back pain. Negative for myalgias, arthralgias, gait problem and neck pain.  Skin: Negative for rash.  Allergic/Immunologic: Negative for immunocompromised state.  Hematological: Negative for adenopathy. Does not bruise/bleed easily.  Psychiatric/Behavioral: Negative for suicidal ideas, sleep disturbance and dysphoric mood. The patient is not nervous/anxious.    Objective:   Today's Vitals: BP 144/95 mmHg  Pulse 98  Temp(Src) 97 F (36.1 C) (Oral)  Resp 16  Ht 6' 2" (1.88 m)  Wt 269 lb (122.018 kg)  BMI 34.52 kg/m2  SpO2 97%  BP Readings from Last 3 Encounters:  07/12/15 144/95  07/07/15 155/94  06/25/15 138/88    Physical Exam  Constitutional: He appears well-developed and well-nourished. No distress.  HENT:  Head: Normocephalic and atraumatic.  Neck: Normal range of motion. Neck supple.  Cardiovascular: Normal rate, regular rhythm, normal heart sounds and intact distal pulses.   Pulmonary/Chest: Effort normal and breath sounds normal.  Musculoskeletal: He exhibits no edema.  R lumbar tenderness with palpable trigger points  Neurological: He is alert.  Skin: Skin is warm and dry. No rash noted. No erythema.  Psychiatric: He has a normal mood and affect.   Assessment & Plan:   Low back pain without sciatica A: low back pain x 2 months. X-ray confirmed b/l L3 pars defect. Patient does not have evidence of spinal stenosis on imaging.  P: Patient to apply for orange card Refilled flexeril and naproxen vicodin  prn, patient has Rx from hospitalization Plan for MRI if pain does not improve. Plan for PT  Home exercise provided   DM (diabetes mellitus) type II uncontrolled with eye manifestation (HCC) A: uncontrolled recently dx diabetic. Uncontrolled due to non compliance P; Refilled med. Max metformin to 1000 mg BID Increase 70/20 to 25 U BID Low carb diet Exercise as tolerated by back, discussed low impact Patient to apply for orange card for referral to opthalmology   HTN (hypertension) A: patient HTN in setting of DM2, hx of gout, recent ARF in setting of hyperglycemia P: Repeat BMP Plan for ARB/ACE i if Cr improving     Outpatient Encounter Prescriptions as of 07/12/2015  Medication Sig  . albuterol (PROVENTIL HFA;VENTOLIN HFA) 108 (90 BASE) MCG/ACT inhaler Inhale 1-2 puffs into the lungs every 6 (six) hours as needed for wheezing or shortness of breath.  . fluticasone (FLONASE) 50 MCG/ACT nasal spray Place 2 sprays into both nostrils daily. (Patient taking differently: Place 2 sprays into both nostrils daily as needed for allergies. )  . glucose monitoring kit (FREESTYLE) monitoring kit 1 each by Does not apply route as needed for other.  . HYDROcodone-acetaminophen (NORCO/VICODIN) 5-325 MG tablet Take 2 tablets by mouth every 4 (four) hours as needed.  . insulin aspart protamine- aspart (NOVOLOG MIX 70/30) (70-30) 100 UNIT/ML injection Inject 0.25 mLs (25 Units total) into the skin 2 (two) times daily with a meal.  . metFORMIN (GLUCOPHAGE) 1000 MG tablet Take 1 tablet (1,000 mg total) by mouth 2 (two) times daily with a meal.  . methocarbamol (ROBAXIN) 500 MG tablet Take 1 tablet (500 mg total) by mouth every 8 (eight) hours as needed for muscle spasms.  . naproxen (NAPROSYN) 500 MG tablet Take 1 tablet (500 mg total) by mouth 2 (two) times daily as needed for moderate pain.  . [DISCONTINUED] insulin aspart protamine- aspart (NOVOLOG MIX 70/30) (70-30) 100 UNIT/ML injection Inject 0.2  mLs (20 Units total) into the skin 2 (two) times daily with a meal.  . [DISCONTINUED] metFORMIN (GLUCOPHAGE) 500 MG tablet Take 1 tablet (500 mg total) by mouth 2 (two) times daily with a meal.  . [DISCONTINUED] methocarbamol (ROBAXIN) 500 MG tablet Take 1 tablet (500 mg total) by mouth 2 (two) times daily. (Patient taking differently: Take 500 mg by mouth every 6 (six) hours as needed for muscle spasms. )  . [DISCONTINUED] naproxen (NAPROSYN) 500 MG tablet Take 1 tablet (500 mg total) by mouth 2 (two) times daily. (Patient taking differently: Take 500 mg by mouth 2 (two) times daily as needed for moderate pain. )  . glucose blood (TRUE METRIX BLOOD GLUCOSE TEST) test strip 1 each by Other route 2 (two) times daily.  . Insulin Syringe-Needle U-100 (B-D INS SYRINGE 0.5CC/31GX5/16) 31G X 5/16" 0.5 ML MISC 1 each by Does not apply route 3 (three) times daily.  . TRUEPLUS LANCETS 28G MISC 1 each by Does not apply route 3 (three) times daily.  . [DISCONTINUED] diazepam (VALIUM) 5 MG tablet Take 1 tablet (5 mg total) by mouth  2 (two) times daily. (Patient not taking: Reported on 07/12/2015)   No facility-administered encounter medications on file as of 07/12/2015.    Follow-up: No Follow-up on file.    Boykin Nearing MD

## 2015-07-12 NOTE — Telephone Encounter (Signed)
-----   Message from Dessa PhiJosalyn Funches, MD sent at 07/12/2015  5:07 PM EDT ----- Screening HIV negative Unfortunately sodium is low, blood sugar high and Cr elevated Patient will have to STOP metformin completely Increase 70/30 to 30 U BID Add januvia 100 mg daily

## 2015-07-12 NOTE — Patient Instructions (Addendum)
[name] was seen today for diabetes.  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 -     insulin aspart protamine- aspart (NOVOLOG MIX 70/30) (70-30) 100 UNIT/ML injection; Inject 0.25 mLs (25 Units total) into the skin 2 (two) times daily with a meal. -     glucose blood (TRUE METRIX BLOOD GLUCOSE TEST) test strip; 1 each by Other route 2 (two) times daily. -     TRUEPLUS LANCETS 28G MISC; 1 each by Does not apply route 3 (three) times daily. -     Insulin Syringe-Needle U-100 (B-D INS SYRINGE 0.5CC/31GX5/16) 31G X 5/16" 0.5 ML MISC; 1 each by Does not apply route 3 (three) times daily.  Screening for HIV (human immunodeficiency virus) -     HIV antibody (with reflex)  Acute renal failure with other specified pathological lesion in kidney [facility](HCC) -     Basic Metabolic Panel  Bilateral low back pain without sciatica -     methocarbamol (ROBAXIN) 500 MG tablet; Take 1 tablet (500 mg total) by mouth every 8 (eight) hours as needed for muscle spasms. -     naproxen (NAPROSYN) 500 MG tablet; Take 1 tablet (500 mg total) by mouth 2 (two) times daily as needed for moderate pain.  Essential hypertension   Diabetes blood sugar goals  Fasting (in AM before breakfast, 8 hrs of no eating or drinking (except water or unsweetened coffee or tea): 90-110 2 hrs after meals: < 160,   No low sugars: nothing < 70   F/u in 3-4 weeks with pharmacy for blood sugar check and blood pressure check  F/u with me in 8 weeks   Dr. Armen PickupFunches

## 2015-07-12 NOTE — Progress Notes (Signed)
F/U DM  Elevated glucose today Stated had Insulin 70/30 20 unit at 830am  Egg and chicken  Low back pain unable to walk and work due to pain  Pain scale #9 Tobacco user 6 cigarette

## 2015-07-13 LAB — MICROALBUMIN / CREATININE URINE RATIO
Creatinine, Urine: 93 mg/dL (ref 20–370)
MICROALB/CREAT RATIO: 17 ug/mg{creat} (ref <30)
Microalb, Ur: 1.6 mg/dL

## 2015-11-03 ENCOUNTER — Emergency Department (HOSPITAL_COMMUNITY): Payer: Self-pay

## 2015-11-03 ENCOUNTER — Encounter (HOSPITAL_COMMUNITY): Payer: Self-pay

## 2015-11-03 ENCOUNTER — Emergency Department (HOSPITAL_COMMUNITY)
Admission: EM | Admit: 2015-11-03 | Discharge: 2015-11-04 | Disposition: A | Discharge status: Home / Self Care | Payer: Self-pay | Attending: Emergency Medicine | Admitting: Emergency Medicine

## 2015-11-03 DIAGNOSIS — E11311 Type 2 diabetes mellitus with unspecified diabetic retinopathy with macular edema: Secondary | ICD-10-CM

## 2015-11-03 DIAGNOSIS — R739 Hyperglycemia, unspecified: Secondary | ICD-10-CM

## 2015-11-03 DIAGNOSIS — Z7984 Long term (current) use of oral hypoglycemic drugs: Secondary | ICD-10-CM | POA: Insufficient documentation

## 2015-11-03 DIAGNOSIS — Z79899 Other long term (current) drug therapy: Secondary | ICD-10-CM | POA: Insufficient documentation

## 2015-11-03 DIAGNOSIS — Z7951 Long term (current) use of inhaled steroids: Secondary | ICD-10-CM | POA: Insufficient documentation

## 2015-11-03 DIAGNOSIS — Z8709 Personal history of other diseases of the respiratory system: Secondary | ICD-10-CM | POA: Insufficient documentation

## 2015-11-03 DIAGNOSIS — E1165 Type 2 diabetes mellitus with hyperglycemia: Secondary | ICD-10-CM | POA: Insufficient documentation

## 2015-11-03 DIAGNOSIS — F17210 Nicotine dependence, cigarettes, uncomplicated: Secondary | ICD-10-CM | POA: Insufficient documentation

## 2015-11-03 DIAGNOSIS — Z794 Long term (current) use of insulin: Secondary | ICD-10-CM | POA: Insufficient documentation

## 2015-11-03 DIAGNOSIS — Z880 Allergy status to penicillin: Secondary | ICD-10-CM | POA: Insufficient documentation

## 2015-11-03 DIAGNOSIS — M109 Gout, unspecified: Secondary | ICD-10-CM | POA: Insufficient documentation

## 2015-11-03 LAB — URINALYSIS, ROUTINE W REFLEX MICROSCOPIC
Bilirubin Urine: NEGATIVE
Glucose, UA: >1000 mg/dL — AB
Hgb urine dipstick: NEGATIVE
Ketones, ur: 15 mg/dL — AB
Leukocytes, UA: NEGATIVE
Nitrite: NEGATIVE
Protein, ur: NEGATIVE mg/dL
Specific Gravity, Urine: 1.027 (ref 1.005–1.030)
pH: 5 (ref 5.0–8.0)

## 2015-11-03 LAB — CBC
HCT: 42.5 % (ref 39.0–52.0)
HEMOGLOBIN: 15.7 g/dL (ref 13.0–17.0)
MCH: 29.5 pg (ref 26.0–34.0)
MCHC: 36.9 g/dL — ABNORMAL HIGH (ref 30.0–36.0)
MCV: 79.7 fL (ref 78.0–100.0)
PLATELETS: 321 10*3/uL (ref 150–400)
RBC: 5.33 MIL/uL (ref 4.22–5.81)
RDW: 13.8 % (ref 11.5–15.5)
WBC: 7.5 10*3/uL (ref 4.0–10.5)

## 2015-11-03 LAB — BASIC METABOLIC PANEL WITH GFR
Anion gap: 16 — ABNORMAL HIGH (ref 5–15)
BUN: 20 mg/dL (ref 6–20)
CO2: 18 mmol/L — ABNORMAL LOW (ref 22–32)
Calcium: 9.8 mg/dL (ref 8.9–10.3)
Chloride: 91 mmol/L — ABNORMAL LOW (ref 101–111)
Creatinine, Ser: 1.6 mg/dL — ABNORMAL HIGH (ref 0.61–1.24)
GFR calc Af Amer: >60 mL/min
GFR calc non Af Amer: 56 mL/min — ABNORMAL LOW
Glucose, Bld: 715 mg/dL (ref 65–99)
Potassium: 5.7 mmol/L — ABNORMAL HIGH (ref 3.5–5.1)
Sodium: 125 mmol/L — ABNORMAL LOW (ref 135–145)

## 2015-11-03 LAB — URINE MICROSCOPIC-ADD ON
Bacteria, UA: NONE SEEN
RBC / HPF: NONE SEEN RBC/hpf (ref 0–5)
WBC, UA: NONE SEEN WBC/hpf (ref 0–5)

## 2015-11-03 LAB — I-STAT TROPONIN, ED: Troponin i, poc: 0 ng/mL (ref 0.00–0.08)

## 2015-11-03 LAB — CBG MONITORING, ED: Glucose-Capillary: >600 mg/dL (ref 65–99)

## 2015-11-03 MED ORDER — SODIUM CHLORIDE 0.9 % IV BOLUS (SEPSIS)
1000.0000 mL | Freq: Once | INTRAVENOUS | Status: AC
  Administered 2015-11-03: 1000 mL via INTRAVENOUS

## 2015-11-03 MED ORDER — MORPHINE SULFATE (PF) 4 MG/ML IV SOLN: 4.0000 mg | Freq: Once | INTRAVENOUS | Status: DC

## 2015-11-03 MED ORDER — SODIUM CHLORIDE 0.9 % IV BOLUS (SEPSIS)
2000.0000 mL | Freq: Once | INTRAVENOUS | Status: AC
  Administered 2015-11-04: 2000 mL via INTRAVENOUS

## 2015-11-03 MED ORDER — INSULIN ASPART 100 UNIT/ML ~~LOC~~ SOLN
14.0000 [IU] | Freq: Once | SUBCUTANEOUS | Status: AC
  Administered 2015-11-04: 14 [IU] via INTRAVENOUS
  Filled 2015-11-03: qty 1

## 2015-11-03 NOTE — ED Notes (Signed)
Pt reports fatique and weak, reports muslce cramps in arms and legs, reports he is diabetic, and has had similar episode in past.

## 2015-11-03 NOTE — ED Notes (Signed)
Also reports chest burning and tachycardia

## 2015-11-04 MED ORDER — INSULIN ASPART PROT & ASPART (70-30 MIX) 100 UNIT/ML ~~LOC~~ SUSP: 30.0000 [IU] | Freq: Two times a day (BID) | SUBCUTANEOUS | Status: DC

## 2015-11-04 NOTE — ED Provider Notes (Signed)
CSN: [id number]     Arrival date & time 11/03/15  1908 History   First MD Initiated Contact with Patient 11/03/15 2042     Chief Complaint  Patient presents with  . Weakness     (Consider location/radiation/quality/duration/timing/severity/associated sxs/prior Treatment) HPI Patient presents to the emergency department complaining of cramping in his arms and legs as well as generalized fatigue and weakness over the past several days.  He has a type II diabetic.  He's been out of his 70:30 over the past several days.  His blood sugars have been elevated.  He presents with a heart rate of 126 and a glucose greater than 600.  No fevers or chills.  No chest pain or shortness of breath.  Denies productive cough.  Denies fevers or chills.   Past Medical History  Diagnosis Date  . Bronchitis   . Gout   . Diabetes mellitus without complication ([place]) 01/2777   History reviewed. No pertinent past surgical history. Family History  Problem Relation Age of Onset  . Hypertension Mother   . Diabetes Father   . Diabetes Paternal Grandmother   . Diabetes Paternal Grandfather    Social History  Substance Use Topics  . Smoking status: Current Every Day Smoker -- 0.50 packs/day    Types: Cigarettes  . Smokeless tobacco: Never Used  . Alcohol Use: No     Comment: occasional    Review of Systems  All other systems reviewed and are negative.     Allergies  Penicillins  Home Medications   Prior to Admission medications   Medication Sig Start Date End Date Taking? Authorizing Provider  insulin aspart protamine- aspart (NOVOLOG MIX 70/30) (70-30) 100 UNIT/ML injection Inject 0.3 mLs (30 Units total) into the skin 2 (two) times daily with a meal. Patient taking differently: Inject 25 Units into the skin 2 (two) times daily with a meal.  07/12/15  Yes Josalyn Funches, MD  metFORMIN (GLUCOPHAGE) 500 MG tablet Take 500 mg by mouth 2 (two) times daily with a meal.   Yes Historical Provider, MD   albuterol (PROVENTIL HFA;VENTOLIN HFA) 108 (90 BASE) MCG/ACT inhaler Inhale 1-2 puffs into the lungs every 6 (six) hours as needed for wheezing or shortness of breath. 07/07/15   Theodis Blaze, MD  fluticasone (FLONASE) 50 MCG/ACT nasal spray Place 2 sprays into both nostrils daily. Patient taking differently: Place 2 sprays into both nostrils daily as needed for allergies.  08/21/14   Courtney Forcucci, PA-C  glucose blood (TRUE METRIX BLOOD GLUCOSE TEST) test strip 1 each by Other route 2 (two) times daily. 07/12/15   Josalyn Funches, MD  glucose monitoring kit (FREESTYLE) monitoring kit 1 each by Does not apply route as needed for other. 11/17/14   Kelvin Cellar, MD  HYDROcodone-acetaminophen (NORCO/VICODIN) 5-325 MG tablet Take 2 tablets by mouth every 4 (four) hours as needed. 07/07/15   Theodis Blaze, MD  Insulin Syringe-Needle U-100 (B-D INS SYRINGE 0.5CC/31GX5/16) 31G X 5/16" 0.5 ML MISC 1 each by Does not apply route 3 (three) times daily. 07/12/15   Josalyn Funches, MD  methocarbamol (ROBAXIN) 500 MG tablet Take 1 tablet (500 mg total) by mouth every 8 (eight) hours as needed for muscle spasms. 07/12/15   Josalyn Funches, MD  naproxen (NAPROSYN) 500 MG tablet Take 1 tablet (500 mg total) by mouth 2 (two) times daily as needed for moderate pain. 07/12/15   Josalyn Funches, MD  sitaGLIPtin (JANUVIA) 100 MG tablet Take 1 tablet (100 mg total)  by mouth daily. 07/12/15   Boykin Nearing, MD  TRUEPLUS LANCETS 28G MISC 1 each by Does not apply route 3 (three) times daily. 07/12/15   Josalyn Funches, MD   BP 129/83 mmHg  Pulse 109  Temp(Src) 98.6 F (37 C) (Oral)  Resp 16  Ht '6\' 2"'$  (1.88 m)  Wt 275 lb (124.739 kg)  BMI 35.29 kg/m2  SpO2 97% Physical Exam  Constitutional: He is oriented to person, place, and time. He appears well-developed and well-nourished.  HENT:  Head: Normocephalic and atraumatic.  Eyes: EOM are normal.  Neck: Normal range of motion.  Cardiovascular: Normal rate,  regular rhythm, normal heart sounds and intact distal pulses.   Pulmonary/Chest: Effort normal and breath sounds normal. No respiratory distress.  Abdominal: Soft. He exhibits no distension. There is no tenderness.  Musculoskeletal: Normal range of motion.  Neurological: He is alert and oriented to person, place, and time.  Skin: Skin is warm and dry.  Psychiatric: He has a normal mood and affect. Judgment normal.  Nursing note and vitals reviewed.   ED Course  Procedures (including critical care time) Labs Review Labs Reviewed  BASIC METABOLIC PANEL - Abnormal; Notable for the following:    Sodium 125 (*)    Potassium 5.7 (*)    Chloride 91 (*)    CO2 18 (*)    Glucose, Bld 715 (*)    Creatinine, Ser 1.60 (*)    GFR calc non Af Amer 56 (*)    Anion gap 16 (*)    All other components within normal limits  CBC - Abnormal; Notable for the following:    MCHC 36.9 (*)    All other components within normal limits  URINALYSIS, ROUTINE W REFLEX MICROSCOPIC (NOT AT [facility]) - Abnormal; Notable for the following:    Glucose, UA >1000 (*)    Ketones, ur 15 (*)    All other components within normal limits  URINE MICROSCOPIC-ADD ON - Abnormal; Notable for the following:    Squamous Epithelial / LPF 0-5 (*)    All other components within normal limits  CBG MONITORING, ED - Abnormal; Notable for the following:    Glucose-Capillary >600 (*)    All other components within normal limits  I-STAT TROPOININ, ED  I-STAT CHEM 8, ED    Imaging Review Dg Chest 2 View  11/03/2015  CLINICAL DATA:  Initial evaluation for acute chest burning, fatigue. EXAM: CHEST  2 VIEW COMPARISON:  Prior study from 11/15/2014. FINDINGS: Sore Normal chest IMPRESSION: No active cardiopulmonary disease. Electronically Signed   By: Jeannine Boga M.D.   On: 11/03/2015 20:16   I have personally reviewed and evaluated these images and lab results as part of my medical decision-making.   EKG Interpretation None       MDM   Final diagnoses:  None    3 L IV fluids given and patient given 14 units of IV insulin.  Likely hyperosmolar hyperglycemia.  Patient be hydrated aggressively and we will recheck his chem 8 at this time.  He is overall well-appearing and nontoxic.  12:09 AM Care to Dr Charisse Klinefelter, MD 11/04/15 [phone]

## 2015-11-04 NOTE — Discharge Instructions (Signed)
We saw you in the ER for the elevated sugars - that have been corrected.  The workup in the ER is not complete, and is limited to screening for life threatening and emergent conditions only, so please see a primary care doctor for further evaluation.   Hyperglycemia Hyperglycemia occurs when the glucose (sugar) in your blood is too high. Hyperglycemia can happen for many reasons, but it most often happens to people who do not know they have diabetes or are not managing their diabetes properly.  CAUSES  Whether you have diabetes or not, there are other causes of hyperglycemia. Hyperglycemia can occur when you have diabetes, but it can also occur in other situations that you might not be as aware of, such as: Diabetes  If you have diabetes and are having problems controlling your blood glucose, hyperglycemia could occur because of some of the following reasons:  Not following your meal plan.  Not taking your diabetes medications or not taking it properly.  Exercising less or doing less activity than you normally do.  Being sick. Pre-diabetes  This cannot be ignored. Before people develop Type 2 diabetes, they almost always have "pre-diabetes." This is when your blood glucose levels are higher than normal, but not yet high enough to be diagnosed as diabetes. Research has shown that some long-term damage to the body, especially the heart and circulatory system, may already be occurring during pre-diabetes. If you take action to manage your blood glucose when you have pre-diabetes, you may delay or prevent Type 2 diabetes from developing. Stress  If you have diabetes, you may be "diet" controlled or on oral medications or insulin to control your diabetes. However, you may find that your blood glucose is higher than usual in the hospital whether you have diabetes or not. This is often referred to as "stress hyperglycemia." Stress can elevate your blood glucose. This happens because of hormones put  out by the body during times of stress. If stress has been the cause of your high blood glucose, it can be followed regularly by your caregiver. That way he/she can make sure your hyperglycemia does not continue to get worse or progress to diabetes. Steroids  Steroids are medications that act on the infection fighting system (immune system) to block inflammation or infection. One side effect can be a rise in blood glucose. Most people can produce enough extra insulin to allow for this rise, but for those who cannot, steroids make blood glucose levels go even higher. It is not unusual for steroid treatments to "uncover" diabetes that is developing. It is not always possible to determine if the hyperglycemia will go away after the steroids are stopped. A special blood test called an A1c is sometimes done to determine if your blood glucose was elevated before the steroids were started. SYMPTOMS  Thirsty.  Frequent urination.  Dry mouth.  Blurred vision.  Tired or fatigue.  Weakness.  Sleepy.  Tingling in feet or leg. DIAGNOSIS  Diagnosis is made by monitoring blood glucose in one or all of the following ways:  A1c test. This is a chemical found in your blood.  Fingerstick blood glucose monitoring.  Laboratory results. TREATMENT  First, knowing the cause of the hyperglycemia is important before the hyperglycemia can be treated. Treatment may include, but is not be limited to:  Education.  Change or adjustment in medications.  Change or adjustment in meal plan.  Treatment for an illness, infection, etc.  More frequent blood glucose monitoring.  Change  in exercise plan.  Decreasing or stopping steroids.  Lifestyle changes. HOME CARE INSTRUCTIONS   Test your blood glucose as directed.  Exercise regularly. Your caregiver will give you instructions about exercise. Pre-diabetes or diabetes which comes on with stress is helped by exercising.  Eat wholesome, balanced meals.  Eat often and at regular, fixed times. Your caregiver or nutritionist will give you a meal plan to guide your sugar intake.  Being at an ideal weight is important. If needed, losing as little as 10 to 15 pounds may help improve blood glucose levels. SEEK MEDICAL CARE IF:   You have questions about medicine, activity, or diet.  You continue to have symptoms (problems such as increased thirst, urination, or weight gain). SEEK IMMEDIATE MEDICAL CARE IF:   You are vomiting or have diarrhea.  Your breath smells fruity.  You are breathing faster or slower.  You are very sleepy or incoherent.  You have numbness, tingling, or pain in your feet or hands.  You have chest pain.  Your symptoms get worse even though you have been following your caregiver's orders.  If you have any other questions or concerns.   This information is not intended to replace advice given to you by your health care provider. Make sure you discuss any questions you have with your health care provider.   Document Released: 02/25/2001 Document Revised: 11/24/2011 Document Reviewed: 05/08/2015 Elsevier Interactive Patient Education [employer]

## 2015-11-05 LAB — I-STAT CHEM 8, ED
BUN: 27 mg/dL — AB (ref 6–20)
CALCIUM ION: 1.09 mmol/L — AB (ref 1.12–1.23)
CHLORIDE: 104 mmol/L (ref 101–111)
CREATININE: 1.4 mg/dL — AB (ref 0.61–1.24)
GLUCOSE: 276 mg/dL — AB (ref 65–99)
HCT: 42 % (ref 39.0–52.0)
Hemoglobin: 14.3 g/dL (ref 13.0–17.0)
Potassium: 5.3 mmol/L — ABNORMAL HIGH (ref 3.5–5.1)
SODIUM: 138 mmol/L (ref 135–145)
TCO2: 21 mmol/L (ref 0–100)

## 2015-11-05 MED FILL — !NOVOLOG MIX 70/30 VIAL: 70-30/ML | 20 days supply | Qty: 10 | Fill #0

## 2021-02-27 ENCOUNTER — Encounter (HOSPITAL_COMMUNITY): Payer: Self-pay | Admitting: Emergency Medicine

## 2021-02-27 ENCOUNTER — Other Ambulatory Visit: Payer: Self-pay

## 2021-02-27 ENCOUNTER — Emergency Department (HOSPITAL_COMMUNITY)
Admission: EM | Admit: 2021-02-27 | Discharge: 2021-02-27 | Disposition: A | Discharge status: Home / Self Care | Payer: Self-pay | Attending: Emergency Medicine | Admitting: Emergency Medicine

## 2021-02-27 ENCOUNTER — Emergency Department (HOSPITAL_COMMUNITY): Payer: Self-pay

## 2021-02-27 DIAGNOSIS — M4306 Spondylolysis, lumbar region: Secondary | ICD-10-CM

## 2021-02-27 DIAGNOSIS — F17210 Nicotine dependence, cigarettes, uncomplicated: Secondary | ICD-10-CM | POA: Insufficient documentation

## 2021-02-27 DIAGNOSIS — M545 Low back pain, unspecified: Secondary | ICD-10-CM | POA: Insufficient documentation

## 2021-02-27 DIAGNOSIS — IMO0002 Reserved for concepts with insufficient information to code with codable children: Secondary | ICD-10-CM

## 2021-02-27 DIAGNOSIS — Z794 Long term (current) use of insulin: Secondary | ICD-10-CM | POA: Insufficient documentation

## 2021-02-27 DIAGNOSIS — I10 Essential (primary) hypertension: Secondary | ICD-10-CM | POA: Insufficient documentation

## 2021-02-27 DIAGNOSIS — E1139 Type 2 diabetes mellitus with other diabetic ophthalmic complication: Secondary | ICD-10-CM | POA: Insufficient documentation

## 2021-02-27 DIAGNOSIS — Z7984 Long term (current) use of oral hypoglycemic drugs: Secondary | ICD-10-CM | POA: Insufficient documentation

## 2021-02-27 DIAGNOSIS — E1100 Type 2 diabetes mellitus with hyperosmolarity without nonketotic hyperglycemic-hyperosmolar coma (NKHHC): Secondary | ICD-10-CM | POA: Insufficient documentation

## 2021-02-27 MED ORDER — HYDROCODONE-ACETAMINOPHEN 5-325 MG PO TABS: 1.0000 | ORAL_TABLET | Freq: Four times a day (QID) | ORAL | 0 refills | Status: DC | PRN

## 2021-02-27 MED ORDER — INSULIN ASPART PROT & ASPART (70-30 MIX) 100 UNIT/ML ~~LOC~~ SUSP: 30.0000 [IU] | Freq: Two times a day (BID) | SUBCUTANEOUS | 3 refills | Status: AC

## 2021-02-27 MED ORDER — KETOROLAC TROMETHAMINE 30 MG/ML IJ SOLN
30.0000 mg | Freq: Once | INTRAMUSCULAR | Status: AC
  Administered 2021-02-27: 30 mg via INTRAMUSCULAR
  Filled 2021-02-27: qty 1

## 2021-02-27 MED ORDER — CYCLOBENZAPRINE HCL 10 MG PO TABS: 10.0000 mg | ORAL_TABLET | Freq: Two times a day (BID) | ORAL | 0 refills | Status: AC | PRN

## 2021-02-27 MED ORDER — HYDROMORPHONE HCL 1 MG/ML IJ SOLN
1.0000 mg | Freq: Once | INTRAMUSCULAR | Status: AC
  Administered 2021-02-27: 1 mg via INTRAMUSCULAR
  Filled 2021-02-27: qty 1

## 2021-02-27 NOTE — ED Triage Notes (Signed)
Pt c/o mid-back pain that started when he woke up today. Denies injury/trauma.

## 2021-02-27 NOTE — ED Provider Notes (Signed)
Emergency Medicine Provider Triage Evaluation Note  Jimmy Jordan , a 35 y.o. male  was evaluated in triage.  Pt complains of atraumatic low back pain for the past few days. Patient denies saddle paraesthesias, bowel/bladder incontinence, lower extremity numbness/tingling, lower extremity weakness, IV drug use, fever/chills, and history of cancer. No known trauma.   Review of Systems  Positive: Low back pain Negative: fever  Physical Exam  BP (!) 170/114 (BP Location: Left Arm)   Pulse 99   Temp 98.7 F (37.1 C) (Oral)   Resp 18   SpO2 98%  Gen:   Awake, no distress   Resp:  Normal effort  MSK:   Moves extremities without difficulty  Other:  TTP to bilateral paraspinal region  Medical Decision Making  Medically screening exam initiated at 8:00 PM.  Appropriate orders placed.  Jimmy Jordan was informed that the remainder of the evaluation will be completed by another provider, this initial triage assessment does not replace that evaluation, and the importance of remaining in the ED until their evaluation is complete.  X-ray ordered to rule out abnormalities; however suspect muscular etiology.    Jimmy Jordan 02/27/21 Jimmy Spence, MD 02/28/21 [phone]

## 2021-02-27 NOTE — ED Provider Notes (Signed)
[facility] EMERGENCY DEPARTMENT Provider Note   CSN: [id number] Arrival date & time: 02/27/21  1945     History Chief Complaint  Patient presents with   Back Pain    Jimmy Jordan is a 35 y.o. male.   Back Pain  Patient presents ED for evaluation of low back pain.  Patient denies any recent falls or injuries but he started having pain in his lower back a few days ago.  The pain is sharp and any movement increases the pain.  He finds it hard to stand up straight and move around.  Pain is on both sides and moves up and down his lower spine.  He denies any numbness or weakness.  No fevers or chills.  No abdominal pain.  No chest pain or shortness of breath  Past Medical History:  Diagnosis Date   Bronchitis    Diabetes mellitus without complication ([place]) 01/538   Gout     Patient Active Problem List   Diagnosis Date Noted   Low back pain without sciatica 07/12/2015   HTN (hypertension) 07/12/2015   Diabetic hyperosmolar non-ketotic state ([place]) 07/06/2015   Hypertension, accelerated 07/06/2015   Obesity 07/06/2015   DM (diabetes mellitus) type II uncontrolled with eye manifestation ([place]) 11/15/2014   ARF (acute renal failure) ([place]) 11/15/2014    History reviewed. No pertinent surgical history.     Family History  Problem Relation Age of Onset   Hypertension Mother    Diabetes Father    Diabetes Paternal Grandmother    Diabetes Paternal Grandfather     Social History   Tobacco Use   Smoking status: Every Day    Packs/day: 0.50    Pack years: 0.00    Types: Cigarettes   Smokeless tobacco: Never  Substance Use Topics   Alcohol use: No    Comment: occasional   Drug use: No    Home Medications Prior to Admission medications   Medication Sig Start Date End Date Taking? Authorizing Provider  albuterol (PROVENTIL HFA;VENTOLIN HFA) 108 (90 BASE) MCG/ACT inhaler Inhale 1-2 puffs into the lungs every 6 (six) hours as needed for wheezing or  shortness of breath. 07/07/15   Theodis Blaze, MD  fluticasone [facility]) 50 MCG/ACT nasal spray Place 2 sprays into both nostrils daily. Patient taking differently: Place 2 sprays into both nostrils daily as needed for allergies.  08/21/14   Rolene Course, PA-C  glucose blood (TRUE METRIX BLOOD GLUCOSE TEST) test strip 1 each by Other route 2 (two) times daily. 07/12/15   Funches, Adriana Mccallum, MD  glucose monitoring kit (FREESTYLE) monitoring kit 1 each by Does not apply route as needed for other. 11/17/14   Kelvin Cellar, MD  HYDROcodone-acetaminophen (NORCO/VICODIN) 5-325 MG tablet Take 2 tablets by mouth every 4 (four) hours as needed. 07/07/15   Theodis Blaze, MD  insulin aspart protamine- aspart (NOVOLOG MIX 70/30) (70-30) 100 UNIT/ML injection Inject 0.3 mLs (30 Units total) into the skin 2 (two) times daily with a meal. 11/04/15   Varney Biles, MD  Insulin Syringe-Needle U-100 (B-D INS SYRINGE 0.5CC/31GX5/16) 31G X 5/16" 0.5 ML MISC 1 each by Does not apply route 3 (three) times daily. 07/12/15   Funches, Adriana Mccallum, MD  metFORMIN (GLUCOPHAGE) 500 MG tablet Take 500 mg by mouth 2 (two) times daily with a meal.    [provider]  methocarbamol (ROBAXIN) 500 MG tablet Take 1 tablet (500 mg total) by mouth every 8 (eight) hours as needed for muscle  spasms. 07/12/15   Funches, Adriana Mccallum, MD  naproxen (NAPROSYN) 500 MG tablet Take 1 tablet (500 mg total) by mouth 2 (two) times daily as needed for moderate pain. 07/12/15   Funches, Adriana Mccallum, MD  sitaGLIPtin (JANUVIA) 100 MG tablet Take 1 tablet (100 mg total) by mouth daily. 07/12/15   Funches, Adriana Mccallum, MD  TRUEPLUS LANCETS 28G MISC 1 each by Does not apply route 3 (three) times daily. 07/12/15   Boykin Nearing, MD    Allergies    Penicillins  Review of Systems   Review of Systems  Musculoskeletal:  Positive for back pain.  All other systems reviewed and are negative.  Physical Exam Updated Vital Signs BP (!) 170/114 (BP Location:  Left Arm)   Pulse 99   Temp 98.7 F (37.1 C) (Oral)   Resp 18   SpO2 98%   Physical Exam Vitals and nursing note reviewed.  Constitutional:      General: He is not in acute distress.    Appearance: He is well-developed.  HENT:     Head: Normocephalic and atraumatic.     Right Ear: External ear normal.     Left Ear: External ear normal.  Eyes:     General: No scleral icterus.       Right eye: No discharge.        Left eye: No discharge.     Conjunctiva/sclera: Conjunctivae normal.  Neck:     Trachea: No tracheal deviation.  Cardiovascular:     Rate and Rhythm: Normal rate.  Pulmonary:     Effort: Pulmonary effort is normal. No respiratory distress.     Breath sounds: No stridor.  Abdominal:     General: There is no distension.  Musculoskeletal:        General: Tenderness present. No swelling, deformity or signs of injury.     Cervical back: Neck supple.     Comments: Tenderness palpation paraspinal region lumbar spine, no erythema, no fluctuance  Skin:    General: Skin is warm and dry.     Findings: No rash.  Neurological:     Mental Status: He is alert.     Cranial Nerves: Cranial nerve deficit: no gross deficits.     Comments: Normal strength and sensation bilateral lower extremities    ED Results / Procedures / Treatments   Labs (all labs ordered are listed, but only abnormal results are displayed) Labs Reviewed - No data to display  EKG None  Radiology DG Lumbar Spine Complete  Result Date: 02/27/2021 CLINICAL DATA:  Low back pain EXAM: LUMBAR SPINE - COMPLETE 4+ VIEW COMPARISON:  06/25/2015 FINDINGS: Lumbar alignment within normal limits. Vertebral body heights and disc spaces appear normal. 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  Otherwise negative Electronically Signed   By: Donavan Foil M.D.   On: 02/27/2021 20:36    Procedures Procedures   Medications Ordered in ED Medications  HYDROmorphone (DILAUDID)  injection 1 mg (has no administration in time range)  ketorolac (TORADOL) 30 MG/ML injection 30 mg (has no administration in time range)    ED Course  I have reviewed the triage vital signs and the nursing notes.  Pertinent labs & imaging results that were available during my care of the patient were reviewed by me and considered in my medical decision making (see chart for details).  Clinical Course as of 02/27/21 2309  Wed Feb 27, 2021  2300 Pain is improving after treatment [JK]  Clinical Course User Index [JK] Dorie Rank, MD   MDM Rules/Calculators/A&P                        Patient presented to the ED with complaints of back pain.  No signs of acute infection.  No signs of any focal neurologic deficits.  Hypertension noted the patient has a history of this in the suspect pain was contributing to that.  Patient was given medications for pain and has noted some improvement.  Will discharge home with a prescription for pain medications.  Recommend outpatient follow-up with orthopedics considering the pars defect.  Also recommend outpatient follow-up with primary care doctor regarding his diabetes and hypertension Final Clinical Impression(s) / ED Diagnoses Final diagnoses:  Pars defect of lumbar spine    Rx / DC Orders ED Discharge Orders     None        Dorie Rank, MD 02/27/21 2312

## 2021-02-27 NOTE — Discharge Instructions (Addendum)
Try contacting [facility] to set up an appointment with a primary care doctor.  Take the medications as prescribed.

## 2021-02-28 NOTE — ED Notes (Signed)
Pt dc prior to prescriptions been send to pharmacy, pt called and notified about prescriptions and at what pharmacy to get them.

## 2021-03-04 ENCOUNTER — Encounter (HOSPITAL_COMMUNITY): Payer: Self-pay | Admitting: Emergency Medicine

## 2021-03-04 ENCOUNTER — Emergency Department (HOSPITAL_COMMUNITY)
Admission: EM | Admit: 2021-03-04 | Discharge: 2021-03-05 | Disposition: A | Discharge status: Home / Self Care | Payer: 59 | Attending: Emergency Medicine | Admitting: Emergency Medicine

## 2021-03-04 ENCOUNTER — Emergency Department (HOSPITAL_COMMUNITY)
Admission: EM | Admit: 2021-03-04 | Discharge: 2021-03-05 | Disposition: A | Discharge status: Home / Self Care | Payer: 59 | Source: Home / Self Care | Attending: Emergency Medicine | Admitting: Emergency Medicine

## 2021-03-04 DIAGNOSIS — X58XXXA Exposure to other specified factors, initial encounter: Secondary | ICD-10-CM | POA: Insufficient documentation

## 2021-03-04 DIAGNOSIS — S39012A Strain of muscle, fascia and tendon of lower back, initial encounter: Secondary | ICD-10-CM | POA: Insufficient documentation

## 2021-03-04 DIAGNOSIS — M545 Low back pain, unspecified: Secondary | ICD-10-CM

## 2021-03-04 DIAGNOSIS — M549 Dorsalgia, unspecified: Secondary | ICD-10-CM | POA: Diagnosis present

## 2021-03-04 DIAGNOSIS — E1165 Type 2 diabetes mellitus with hyperglycemia: Secondary | ICD-10-CM | POA: Insufficient documentation

## 2021-03-04 DIAGNOSIS — Z794 Long term (current) use of insulin: Secondary | ICD-10-CM | POA: Insufficient documentation

## 2021-03-04 DIAGNOSIS — I10 Essential (primary) hypertension: Secondary | ICD-10-CM | POA: Insufficient documentation

## 2021-03-04 DIAGNOSIS — Z7984 Long term (current) use of oral hypoglycemic drugs: Secondary | ICD-10-CM | POA: Insufficient documentation

## 2021-03-04 DIAGNOSIS — R739 Hyperglycemia, unspecified: Secondary | ICD-10-CM

## 2021-03-04 DIAGNOSIS — F17210 Nicotine dependence, cigarettes, uncomplicated: Secondary | ICD-10-CM | POA: Insufficient documentation

## 2021-03-04 LAB — CBC WITH DIFFERENTIAL/PLATELET
Abs Immature Granulocytes: 0.01 10*3/uL (ref 0.00–0.07)
Basophils Absolute: 0.1 10*3/uL (ref 0.0–0.1)
Basophils Relative: 1 %
Eosinophils Absolute: 0.2 10*3/uL (ref 0.0–0.5)
Eosinophils Relative: 2 %
HCT: 40.9 % (ref 39.0–52.0)
Hemoglobin: 14.2 g/dL (ref 13.0–17.0)
Immature Granulocytes: 0 %
Lymphocytes Relative: 39 %
Lymphs Abs: 2.7 10*3/uL (ref 0.7–4.0)
MCH: 28.7 pg (ref 26.0–34.0)
MCHC: 34.7 g/dL (ref 30.0–36.0)
MCV: 82.6 fL (ref 80.0–100.0)
Monocytes Absolute: 0.4 10*3/uL (ref 0.1–1.0)
Monocytes Relative: 6 %
Neutro Abs: 3.5 10*3/uL (ref 1.7–7.7)
Neutrophils Relative %: 52 %
Platelets: 258 10*3/uL (ref 150–400)
RBC: 4.95 MIL/uL (ref 4.22–5.81)
RDW: 12.7 % (ref 11.5–15.5)
WBC: 6.9 10*3/uL (ref 4.0–10.5)
nRBC: 0 % (ref 0.0–0.2)

## 2021-03-04 MED ORDER — METHOCARBAMOL 1000 MG/10ML IJ SOLN
500.0000 mg | Freq: Once | INTRAVENOUS | Status: AC
  Administered 2021-03-05: 500 mg via INTRAVENOUS
  Filled 2021-03-04: qty 500

## 2021-03-04 MED ORDER — KETOROLAC TROMETHAMINE 15 MG/ML IJ SOLN
15.0000 mg | Freq: Once | INTRAMUSCULAR | Status: AC
  Administered 2021-03-05: 15 mg via INTRAVENOUS
  Filled 2021-03-04: qty 1

## 2021-03-04 MED ORDER — SODIUM CHLORIDE 0.9 % IV BOLUS
500.0000 mL | Freq: Once | INTRAVENOUS | Status: AC
  Administered 2021-03-05: 500 mL via INTRAVENOUS

## 2021-03-04 NOTE — ED Provider Notes (Signed)
[facility] DEPT Provider Note   CSN: [id number] Arrival date & time: 03/04/21  2243     History Chief Complaint  Patient presents with   Back Pain    Jimmy Jordan is a 35 y.o. male.  35 year old male with a history of insulin-dependent diabetes presents to the emergency department for evaluation of low back pain.  He has been experiencing low back pain x1 week.  It is constant and waxes and wanes in severity.  Worse when he is standing on his feet at work for long hours.  He took ibuprofen on 2 isolated occasions without significant relief.  Otherwise, has not attempted any other interventions.  Was seen in the emergency department for this 5 days ago, but denies receiving any prescriptions.  Triage note references dark urine, but patient denies any changes to his urinary color.  He has been out of his insulin for 1 month as he relocated from [place] 4 months ago and does not have a primary care doctor to prescribe him his medications.  No fevers, dysuria, vomiting, genital or perianal numbness, extremity numbness or paresthesias.  No hx of IVDU.  The history is provided by the patient. No language interpreter was used.  Back Pain     Past Medical History:  Diagnosis Date   Bronchitis    Diabetes mellitus without complication ([place]) 10/3534   Gout     Patient Active Problem List   Diagnosis Date Noted   Low back pain without sciatica 07/12/2015   HTN (hypertension) 07/12/2015   Diabetic hyperosmolar non-ketotic state ([place]) 07/06/2015   Hypertension, accelerated 07/06/2015   Obesity 07/06/2015   DM (diabetes mellitus) type II uncontrolled with eye manifestation ([place]) 11/15/2014   ARF (acute renal failure) ([place]) 11/15/2014    History reviewed. No pertinent surgical history.     Family History  Problem Relation Age of Onset   Hypertension Mother    Diabetes Father    Diabetes Paternal Grandmother    Diabetes Paternal Grandfather      Social History   Tobacco Use   Smoking status: Every Day    Packs/day: 0.50    Pack years: 0.00    Types: Cigarettes   Smokeless tobacco: Never  Substance Use Topics   Alcohol use: No    Comment: occasional   Drug use: No    Home Medications Prior to Admission medications   Medication Sig Start Date End Date Taking? Authorizing Provider  albuterol (PROVENTIL HFA;VENTOLIN HFA) 108 (90 BASE) MCG/ACT inhaler Inhale 1-2 puffs into the lungs every 6 (six) hours as needed for wheezing or shortness of breath. 07/07/15   Theodis Blaze, MD  cyclobenzaprine (FLEXERIL) 10 MG tablet Take 1 tablet (10 mg total) by mouth 2 (two) times daily as needed for muscle spasms. 02/27/21   Dorie Rank, MD  fluticasone (FLONASE) 50 MCG/ACT nasal spray Place 2 sprays into both nostrils daily. Patient taking differently: Place 2 sprays into both nostrils daily as needed for allergies.  08/21/14   Rolene Course, PA-C  glucose blood (TRUE METRIX BLOOD GLUCOSE TEST) test strip 1 each by Other route 2 (two) times daily. 07/12/15   Funches, Adriana Mccallum, MD  glucose monitoring kit (FREESTYLE) monitoring kit 1 each by Does not apply route as needed for other. 11/17/14   Kelvin Cellar, MD  HYDROcodone-acetaminophen (NORCO/VICODIN) 5-325 MG tablet Take 1 tablet by mouth every 6 (six) hours as needed. 02/27/21   Dorie Rank, MD  insulin aspart protamine- aspart (NOVOLOG  MIX 70/30) (70-30) 100 UNIT/ML injection Inject 0.3 mLs (30 Units total) into the skin 2 (two) times daily with a meal. 02/27/21   Dorie Rank, MD  Insulin Syringe-Needle U-100 (B-D INS SYRINGE 0.5CC/31GX5/16) 31G X 5/16" 0.5 ML MISC 1 each by Does not apply route 3 (three) times daily. 07/12/15   Funches, Adriana Mccallum, MD  metFORMIN (GLUCOPHAGE) 500 MG tablet Take 500 mg by mouth 2 (two) times daily with a meal.    [provider]  methocarbamol (ROBAXIN) 500 MG tablet Take 1 tablet (500 mg total) by mouth every 8 (eight) hours as needed for muscle spasms.  07/12/15   Funches, Adriana Mccallum, MD  naproxen (NAPROSYN) 500 MG tablet Take 1 tablet (500 mg total) by mouth 2 (two) times daily as needed for moderate pain. 03/05/21   Antonietta Breach, PA-C  sitaGLIPtin (JANUVIA) 100 MG tablet Take 1 tablet (100 mg total) by mouth daily. 07/12/15   Funches, Adriana Mccallum, MD  TRUEPLUS LANCETS 28G MISC 1 each by Does not apply route 3 (three) times daily. 07/12/15   Boykin Nearing, MD    Allergies    Penicillins  Review of Systems   Review of Systems  Musculoskeletal:  Positive for back pain.  Ten systems reviewed and are negative for acute change, except as noted in the HPI.    Physical Exam Updated Vital Signs BP (!) 162/104 (BP Location: Left Arm)   Pulse 79   Temp (!) 97.4 F (36.3 C) (Oral)   Resp 18   Ht _0  (1.88 m)   Wt 124.7 kg   SpO2 95%   BMI 35.31 kg/m   Physical Exam Vitals and nursing note reviewed.  Constitutional:      General: He is not in acute distress.    Appearance: He is well-developed. He is not diaphoretic.     Comments: Nontoxic appearing and in NAD  HENT:     Head: Normocephalic and atraumatic.  Eyes:     General: No scleral icterus.    Conjunctiva/sclera: Conjunctivae normal.  Cardiovascular:     Rate and Rhythm: Normal rate and regular rhythm.     Pulses: Normal pulses.  Pulmonary:     Effort: Pulmonary effort is normal. No respiratory distress.     Comments: Respirations even and unlabored Musculoskeletal:     Cervical back: Normal range of motion.     Comments: Patient with tenderness to palpation to his left lumbar paraspinal muscles with appreciable spasm.  No bony deformities, step-offs, crepitus to the lumbosacral midline.  Skin:    General: Skin is warm and dry.     Coloration: Skin is not pale.     Findings: No erythema or rash.  Neurological:     Mental Status: He is alert and oriented to person, place, and time.     Coordination: Coordination normal.     Comments: Sensation intact in BLE   Psychiatric:        Behavior: Behavior normal.    ED Results / Procedures / Treatments   Labs (all labs ordered are listed, but only abnormal results are displayed) Labs Reviewed  BASIC METABOLIC PANEL - Abnormal; Notable for the following components:      Result Value   Glucose, Bld 275 (*)    Creatinine, Ser 1.29 (*)    All other components within normal limits  URINALYSIS, ROUTINE W REFLEX MICROSCOPIC - Abnormal; Notable for the following components:   Glucose, UA 150 (*)    Hgb urine dipstick SMALL (*)  Protein, ur 30 (*)    All other components within normal limits  CBC WITH DIFFERENTIAL/PLATELET  CK    EKG None  Radiology No results found.  Procedures Procedures   Medications Ordered in ED Medications  ketorolac (TORADOL) 15 MG/ML injection 15 mg (15 mg Intravenous Given 03/05/21 0023)  methocarbamol (ROBAXIN) 500 mg in dextrose 5 % 50 mL IVPB (0 mg Intravenous Stopped 03/05/21 0126)  sodium chloride 0.9 % bolus 500 mL (0 mLs Intravenous Stopped 03/05/21 0126)  oxyCODONE-acetaminophen (PERCOCET/ROXICET) 5-325 MG per tablet 2 tablet (2 tablets Oral Given 03/05/21 0155)    ED Course  I have reviewed the triage vital signs and the nursing notes.  Pertinent labs & imaging results that were available during my care of the patient were reviewed by me and considered in my medical decision making (see chart for details).    MDM Rules/Calculators/A&P                          35 year old male presents to the emergency department for evaluation of persistent lower back pain.  Has obvious paraspinal spasms on exam.  No midline tenderness of the lumbar spine.  No bony deformities, step-offs, crepitus.  Denies history of trauma.  No red flags or signs concerning for cauda equina.  His symptoms have been managed supportively with Percocet, IV Toradol, Robaxin.  He has had some improvement in his back pain with these medications.  Plan for discharge with similar  regimen.  Patient states he relocated to the area about 4 months ago.  Has been out of his insulin x1 month.  It appears that a prescription for NovoLog was sent to [facility] following his ED visit 5 days ago.  The patient has been encouraged to pick up this prescription.  While hyperglycemic, evaluation today is not suggestive of DKA.  Given referral for PCP follow up.  Return precautions discussed and provided.  Patient discharged in stable condition with no unaddressed concerns.   Final Clinical Impression(s) / ED Diagnoses Final diagnoses:  Lumbosacral strain, initial encounter  Hyperglycemia  Bilateral low back pain without sciatica  Hypertension not at goal    Rx / DC Orders ED Discharge Orders          Ordered    naproxen (NAPROSYN) 500 MG tablet  2 times daily PRN        03/05/21 0221             Antonietta Breach, PA-C 03/05/21 0245    Fatima Blank, MD 03/05/21 [phone]

## 2021-03-04 NOTE — ED Triage Notes (Addendum)
Pt reports continued lower back pain since last Monday. He was seen at [facility] for similar 5 days ago. States his urine is dark. Denies constipation. States that pain is worse when standing or being still for a long time, Moving generally improves the pain. A&Ox4.

## 2021-03-05 LAB — URINALYSIS, ROUTINE W REFLEX MICROSCOPIC
Bacteria, UA: NONE SEEN
Bilirubin Urine: NEGATIVE
Glucose, UA: 150 mg/dL — AB
Ketones, ur: NEGATIVE mg/dL
Leukocytes,Ua: NEGATIVE
Nitrite: NEGATIVE
Protein, ur: 30 mg/dL — AB
Specific Gravity, Urine: 1.012 (ref 1.005–1.030)
pH: 5 (ref 5.0–8.0)

## 2021-03-05 LAB — BASIC METABOLIC PANEL
Anion gap: 9 (ref 5–15)
BUN: 16 mg/dL (ref 6–20)
CO2: 24 mmol/L (ref 22–32)
Calcium: 9.4 mg/dL (ref 8.9–10.3)
Chloride: 103 mmol/L (ref 98–111)
Creatinine, Ser: 1.29 mg/dL — ABNORMAL HIGH (ref 0.61–1.24)
GFR, Estimated: >60 mL/min (ref >60)
Glucose, Bld: 275 mg/dL — ABNORMAL HIGH (ref 70–99)
Potassium: 3.9 mmol/L (ref 3.5–5.1)
Sodium: 136 mmol/L (ref 135–145)

## 2021-03-05 LAB — CK: Total CK: 223 U/L (ref 49–397)

## 2021-03-05 MED ORDER — NAPROXEN 500 MG PO TABS: 500.0000 mg | ORAL_TABLET | Freq: Two times a day (BID) | ORAL | 0 refills | Status: AC | PRN

## 2021-03-05 MED ORDER — OXYCODONE-ACETAMINOPHEN 5-325 MG PO TABS
2.0000 | ORAL_TABLET | Freq: Once | ORAL | Status: AC
Start: 2021-03-05 — End: 2021-03-05
  Administered 2021-03-05: 2 via ORAL
  Filled 2021-03-05: qty 2

## 2021-03-05 NOTE — ED Notes (Signed)
Pt called 3x no answer  

## 2021-03-05 NOTE — Discharge Instructions (Addendum)
Take Naproxen as prescribed for symptoms. You also have prescriptions for Flexeril and Norco that were sent to the [facility] on [address] after your last ED visit; take these as prescribed for pain.  Lantus will require prior authorization by a primary care doctor; however, Novolog 70/30 was called into this [facility] pharmacy 5 days ago as well. You can procure these prescriptions at any time.   Avoid strenuous activity or heavy lifting for improvement in your back.  You have been given a referral to primary care at [facility] to establish care with a primary doctor.  Call to make an appointment.  Return to the ED for new or concerning symptoms.

## 2021-03-06 ENCOUNTER — Other Ambulatory Visit: Payer: Self-pay

## 2021-03-06 DIAGNOSIS — Z7984 Long term (current) use of oral hypoglycemic drugs: Secondary | ICD-10-CM | POA: Diagnosis not present

## 2021-03-06 DIAGNOSIS — E119 Type 2 diabetes mellitus without complications: Secondary | ICD-10-CM | POA: Insufficient documentation

## 2021-03-06 DIAGNOSIS — Z794 Long term (current) use of insulin: Secondary | ICD-10-CM | POA: Insufficient documentation

## 2021-03-06 DIAGNOSIS — M545 Low back pain, unspecified: Secondary | ICD-10-CM | POA: Diagnosis not present

## 2021-03-06 DIAGNOSIS — Z0000 Encounter for general adult medical examination without abnormal findings: Secondary | ICD-10-CM | POA: Insufficient documentation

## 2021-03-06 DIAGNOSIS — F17210 Nicotine dependence, cigarettes, uncomplicated: Secondary | ICD-10-CM | POA: Diagnosis not present

## 2021-03-06 DIAGNOSIS — I10 Essential (primary) hypertension: Secondary | ICD-10-CM | POA: Insufficient documentation

## 2021-03-07 ENCOUNTER — Other Ambulatory Visit: Payer: Self-pay

## 2021-03-07 ENCOUNTER — Emergency Department (HOSPITAL_COMMUNITY)
Admission: EM | Admit: 2021-03-07 | Discharge: 2021-03-07 | Disposition: A | Discharge status: Home / Self Care | Payer: 59 | Attending: Emergency Medicine | Admitting: Emergency Medicine

## 2021-03-07 ENCOUNTER — Encounter (HOSPITAL_COMMUNITY): Payer: Self-pay | Admitting: Emergency Medicine

## 2021-03-07 ENCOUNTER — Emergency Department (HOSPITAL_COMMUNITY)
Admission: EM | Admit: 2021-03-07 | Discharge: 2021-03-07 | Disposition: A | Discharge status: Home / Self Care | Payer: 59 | Source: Home / Self Care | Attending: Emergency Medicine | Admitting: Emergency Medicine

## 2021-03-07 DIAGNOSIS — F17210 Nicotine dependence, cigarettes, uncomplicated: Secondary | ICD-10-CM | POA: Insufficient documentation

## 2021-03-07 DIAGNOSIS — E119 Type 2 diabetes mellitus without complications: Secondary | ICD-10-CM | POA: Insufficient documentation

## 2021-03-07 DIAGNOSIS — Z7984 Long term (current) use of oral hypoglycemic drugs: Secondary | ICD-10-CM | POA: Insufficient documentation

## 2021-03-07 DIAGNOSIS — Z7689 Persons encountering health services in other specified circumstances: Secondary | ICD-10-CM

## 2021-03-07 DIAGNOSIS — Z794 Long term (current) use of insulin: Secondary | ICD-10-CM | POA: Insufficient documentation

## 2021-03-07 DIAGNOSIS — Z0000 Encounter for general adult medical examination without abnormal findings: Secondary | ICD-10-CM | POA: Insufficient documentation

## 2021-03-07 DIAGNOSIS — I10 Essential (primary) hypertension: Secondary | ICD-10-CM | POA: Insufficient documentation

## 2021-03-07 NOTE — ED Triage Notes (Signed)
Patient here for work note to go back to work after having back pain on the 20th.

## 2021-03-07 NOTE — ED Triage Notes (Signed)
Pt arriving POV with complaint of lower back pain since last Monday. Seen on the 20th for same. Pt states he simply needs a doctors note so he can return to work.

## 2021-03-07 NOTE — ED Provider Notes (Signed)
[facility] EMERGENCY DEPARTMENT Provider Note   CSN: [id number] Arrival date & time: 03/07/21  0346     History No chief complaint on file.   Jimmy Jordan is a 35 y.o. male.  Patient reports he has been seen and treated for back pain in the ED recently. He has been taking Naproxen and is much better. No weakness or numbness at any time. He states he is here because he needs a note that he is ok to go back to work.   The history is provided by the patient. No language interpreter was used.      Past Medical History:  Diagnosis Date   Bronchitis    Diabetes mellitus without complication ([place]) 11/8754   Gout     Patient Active Problem List   Diagnosis Date Noted   Low back pain without sciatica 07/12/2015   HTN (hypertension) 07/12/2015   Diabetic hyperosmolar non-ketotic state ([place]) 07/06/2015   Hypertension, accelerated 07/06/2015   Obesity 07/06/2015   DM (diabetes mellitus) type II uncontrolled with eye manifestation ([place]) 11/15/2014   ARF (acute renal failure) ([place]) 11/15/2014    No past surgical history on file.     Family History  Problem Relation Age of Onset   Hypertension Mother    Diabetes Father    Diabetes Paternal Grandmother    Diabetes Paternal Grandfather     Social History   Tobacco Use   Smoking status: Every Day    Packs/day: 0.50    Pack years: 0.00    Types: Cigarettes   Smokeless tobacco: Never  Substance Use Topics   Alcohol use: No    Comment: occasional   Drug use: No    Home Medications Prior to Admission medications   Medication Sig Start Date End Date Taking? Authorizing Provider  albuterol (PROVENTIL HFA;VENTOLIN HFA) 108 (90 BASE) MCG/ACT inhaler Inhale 1-2 puffs into the lungs every 6 (six) hours as needed for wheezing or shortness of breath. 07/07/15   Theodis Blaze, MD  cyclobenzaprine (FLEXERIL) 10 MG tablet Take 1 tablet (10 mg total) by mouth 2 (two) times daily as needed for muscle spasms.  02/27/21   Dorie Rank, MD  fluticasone (FLONASE) 50 MCG/ACT nasal spray Place 2 sprays into both nostrils daily. Patient taking differently: Place 2 sprays into both nostrils daily as needed for allergies.  08/21/14   Rolene Course, PA-C  glucose blood (TRUE METRIX BLOOD GLUCOSE TEST) test strip 1 each by Other route 2 (two) times daily. 07/12/15   Funches, Adriana Mccallum, MD  glucose monitoring kit (FREESTYLE) monitoring kit 1 each by Does not apply route as needed for other. 11/17/14   Kelvin Cellar, MD  HYDROcodone-acetaminophen (NORCO/VICODIN) 5-325 MG tablet Take 1 tablet by mouth every 6 (six) hours as needed. 02/27/21   Dorie Rank, MD  insulin aspart protamine- aspart (NOVOLOG MIX 70/30) (70-30) 100 UNIT/ML injection Inject 0.3 mLs (30 Units total) into the skin 2 (two) times daily with a meal. 02/27/21   Dorie Rank, MD  Insulin Syringe-Needle U-100 (B-D INS SYRINGE 0.5CC/31GX5/16) 31G X 5/16" 0.5 ML MISC 1 each by Does not apply route 3 (three) times daily. 07/12/15   Funches, Adriana Mccallum, MD  metFORMIN (GLUCOPHAGE) 500 MG tablet Take 500 mg by mouth 2 (two) times daily with a meal.    [provider]  methocarbamol (ROBAXIN) 500 MG tablet Take 1 tablet (500 mg total) by mouth every 8 (eight) hours as needed for muscle spasms. 07/12/15   Funches,  Josalyn, MD  naproxen (NAPROSYN) 500 MG tablet Take 1 tablet (500 mg total) by mouth 2 (two) times daily as needed for moderate pain. 03/05/21   Antonietta Breach, PA-C  sitaGLIPtin (JANUVIA) 100 MG tablet Take 1 tablet (100 mg total) by mouth daily. 07/12/15   Funches, Adriana Mccallum, MD  TRUEPLUS LANCETS 28G MISC 1 each by Does not apply route 3 (three) times daily. 07/12/15   Boykin Nearing, MD    Allergies    Penicillins  Review of Systems   Review of Systems  Constitutional:  Negative for fever.  Genitourinary: Negative.   Musculoskeletal:  Positive for back pain.  Neurological:  Negative for weakness and numbness.   Physical Exam Updated Vital  Signs BP (!) 155/109 (BP Location: Right Arm)   Pulse 89   Temp 98.1 F (36.7 C) (Oral)   Resp 16   SpO2 99%   Physical Exam Vitals and nursing note reviewed.  Constitutional:      Appearance: He is well-developed.  Pulmonary:     Effort: Pulmonary effort is normal.  Musculoskeletal:        General: Normal range of motion.     Cervical back: Normal range of motion.  Skin:    General: Skin is warm and dry.  Neurological:     Mental Status: He is alert and oriented to person, place, and time.     Comments: Ambulatory without limitation.    ED Results / Procedures / Treatments   Labs (all labs ordered are listed, but only abnormal results are displayed) Labs Reviewed - No data to display  EKG None  Radiology No results found.  Procedures Procedures   Medications Ordered in ED Medications - No data to display  ED Course  I have reviewed the triage vital signs and the nursing notes.  Pertinent labs & imaging results that were available during my care of the patient were reviewed by me and considered in my medical decision making (see chart for details).    MDM Rules/Calculators/A&P                          Patient to ED with improved/resolved back pain requesting note to return to work.    Final Clinical Impression(s) / ED Diagnoses Final diagnoses:  None   Back pain, resolved Work note  Rx / DC Orders ED Discharge Orders     None        [name], PA-C 45/62/56 3893    Delora Fuel, MD 73/42/87 [phone]

## 2021-03-07 NOTE — ED Notes (Signed)
I went out into the lobby to see another patient. This patient then approached me aggressively complaining about his wait time. I explained to him that we are not placing patient simply based on time. I attempted to explained the acuity process and how this affects his wait time along with everyone else's. Pt continued to get verbally aggressive with me. I told the patient if he needed to leave then we understand but otherwise he would have to wait until we can place him in a room. He continued to talk but I walked back into the triage area.

## 2021-03-07 NOTE — Discharge Instructions (Addendum)
Followup with your doctor as needed

## 2022-03-15 ENCOUNTER — Other Ambulatory Visit: Payer: Self-pay

## 2022-03-15 ENCOUNTER — Emergency Department (HOSPITAL_BASED_OUTPATIENT_CLINIC_OR_DEPARTMENT_OTHER)
Admission: EM | Admit: 2022-03-15 | Discharge: 2022-03-15 | Disposition: A | Discharge status: Home / Self Care | Payer: 59 | Attending: Emergency Medicine | Admitting: Emergency Medicine

## 2022-03-15 ENCOUNTER — Encounter (HOSPITAL_BASED_OUTPATIENT_CLINIC_OR_DEPARTMENT_OTHER): Payer: Self-pay | Admitting: Emergency Medicine

## 2022-03-15 DIAGNOSIS — Z794 Long term (current) use of insulin: Secondary | ICD-10-CM | POA: Insufficient documentation

## 2022-03-15 DIAGNOSIS — R369 Urethral discharge, unspecified: Secondary | ICD-10-CM | POA: Insufficient documentation

## 2022-03-15 DIAGNOSIS — Z202 Contact with and (suspected) exposure to infections with a predominantly sexual mode of transmission: Secondary | ICD-10-CM | POA: Insufficient documentation

## 2022-03-15 DIAGNOSIS — Z79899 Other long term (current) drug therapy: Secondary | ICD-10-CM | POA: Insufficient documentation

## 2022-03-15 LAB — URINALYSIS, ROUTINE W REFLEX MICROSCOPIC
Bilirubin Urine: NEGATIVE
Glucose, UA: >=500 mg/dL — AB
Ketones, ur: NEGATIVE mg/dL
Leukocytes,Ua: NEGATIVE
Nitrite: NEGATIVE
Protein, ur: 100 mg/dL — AB
Specific Gravity, Urine: 1.02 (ref 1.005–1.030)
pH: 5.5 (ref 5.0–8.0)

## 2022-03-15 LAB — URINALYSIS, MICROSCOPIC (REFLEX)

## 2022-03-15 LAB — CBG MONITORING, ED: Glucose-Capillary: 295 mg/dL — ABNORMAL HIGH (ref 70–99)

## 2022-03-15 MED ORDER — CEFTRIAXONE SODIUM 500 MG IJ SOLR
500.0000 mg | Freq: Once | INTRAMUSCULAR | Status: AC
  Administered 2022-03-15: 500 mg via INTRAMUSCULAR
  Filled 2022-03-15: qty 500

## 2022-03-15 MED ORDER — DOXYCYCLINE HYCLATE 100 MG PO CAPS: 100.0000 mg | ORAL_CAPSULE | Freq: Two times a day (BID) | ORAL | 0 refills | Status: AC

## 2022-03-15 MED ORDER — LIDOCAINE HCL (PF) 1 % IJ SOLN
INTRAMUSCULAR | Status: AC
  Administered 2022-03-15: 1 mL
  Filled 2022-03-15: qty 5

## 2022-03-15 NOTE — ED Notes (Signed)
ED Provider at bedside. 

## 2022-03-15 NOTE — ED Triage Notes (Signed)
Pt arrives pov, steady gait, c/o penile burning and d/c starting yesterday. Denies fever

## 2022-03-15 NOTE — ED Notes (Signed)
Pt requested cbg check

## 2022-03-15 NOTE — ED Provider Notes (Signed)
[facility] EMERGENCY DEPARTMENT Provider Note   CSN: [id number] Arrival date & time: 03/15/22  1019     History  Chief Complaint  Patient presents with   Penile Discharge    Jimmy Jordan is a 36 y.o. male who presents emergency department for evaluation of penile discharge and irritation x2 days.  Describes discharge as thick and opaque.  Patient states that he does have a new sexual partner and has concern for a sexually transmitted disease.  He endorses some irritation around the urethra but otherwise denies rashes or lesions.  Per ROS, denies fever, chills, nausea, vomiting, hematuria and dysuria.  No treatment prior to arrival.  No previous history of STDs or similar symptoms.   Penile Discharge       Home Medications Prior to Admission medications   Medication Sig Start Date End Date Taking? Authorizing Provider  doxycycline (VIBRAMYCIN) 100 MG capsule Take 1 capsule (100 mg total) by mouth 2 (two) times daily. 03/15/22  Yes Raynald Blend R, PA-C  albuterol (PROVENTIL HFA;VENTOLIN HFA) 108 (90 BASE) MCG/ACT inhaler Inhale 1-2 puffs into the lungs every 6 (six) hours as needed for wheezing or shortness of breath. 07/07/15   Dorothea Ogle, MD  cyclobenzaprine (FLEXERIL) 10 MG tablet Take 1 tablet (10 mg total) by mouth 2 (two) times daily as needed for muscle spasms. 02/27/21   Linwood Dibbles, MD  fluticasone (FLONASE) 50 MCG/ACT nasal spray Place 2 sprays into both nostrils daily. Patient taking differently: Place 2 sprays into both nostrils daily as needed for allergies.  08/21/14   Shirleen Schirmer, PA-C  glucose blood (TRUE METRIX BLOOD GLUCOSE TEST) test strip 1 each by Other route 2 (two) times daily. 07/12/15   Funches, Gerilyn Nestle, MD  glucose monitoring kit (FREESTYLE) monitoring kit 1 each by Does not apply route as needed for other. 11/17/14   Jeralyn Bennett, MD  HYDROcodone-acetaminophen (NORCO/VICODIN) 5-325 MG tablet Take 1 tablet by mouth every 6 (six) hours as  needed. 02/27/21   Linwood Dibbles, MD  insulin aspart protamine- aspart (NOVOLOG MIX 70/30) (70-30) 100 UNIT/ML injection Inject 0.3 mLs (30 Units total) into the skin 2 (two) times daily with a meal. 02/27/21   Linwood Dibbles, MD  Insulin Syringe-Needle U-100 (B-D INS SYRINGE 0.5CC/31GX5/16) 31G X 5/16" 0.5 ML MISC 1 each by Does not apply route 3 (three) times daily. 07/12/15   Funches, Gerilyn Nestle, MD  metFORMIN (GLUCOPHAGE) 500 MG tablet Take 500 mg by mouth 2 (two) times daily with a meal.    [provider]  methocarbamol (ROBAXIN) 500 MG tablet Take 1 tablet (500 mg total) by mouth every 8 (eight) hours as needed for muscle spasms. 07/12/15   Funches, Gerilyn Nestle, MD  naproxen (NAPROSYN) 500 MG tablet Take 1 tablet (500 mg total) by mouth 2 (two) times daily as needed for moderate pain. 03/05/21   Antony Madura, PA-C  sitaGLIPtin (JANUVIA) 100 MG tablet Take 1 tablet (100 mg total) by mouth daily. 07/12/15   Funches, Gerilyn Nestle, MD  TRUEPLUS LANCETS 28G MISC 1 each by Does not apply route 3 (three) times daily. 07/12/15   Dessa Phi, MD      Allergies    Penicillins    Review of Systems   Review of Systems  Genitourinary:  Positive for penile discharge.    Physical Exam Updated Vital Signs BP (!) 142/103   Pulse 89   Temp 98.2 F (36.8 C)   Resp 18   Ht 6\' 1"  (1.854 m)  Wt 108.9 kg   SpO2 97%   BMI 31.66 kg/m  Physical Exam Vitals and nursing note reviewed.  Constitutional:      General: He is not in acute distress.    Appearance: He is not ill-appearing.  HENT:     Head: Atraumatic.  Eyes:     Conjunctiva/sclera: Conjunctivae normal.  Cardiovascular:     Rate and Rhythm: Normal rate and regular rhythm.     Pulses: Normal pulses.     Heart sounds: No murmur heard. Pulmonary:     Effort: Pulmonary effort is normal. No respiratory distress.     Breath sounds: Normal breath sounds.  Abdominal:     General: Abdomen is flat. There is no distension.     Palpations: Abdomen  is soft.     Tenderness: There is no abdominal tenderness.  Genitourinary:    Comments: Redness noted around urethra.  Clear discharge.  No lesions or rashes. Musculoskeletal:        General: Normal range of motion.     Cervical back: Normal range of motion.  Skin:    General: Skin is warm and dry.     Capillary Refill: Capillary refill takes less than 2 seconds.  Neurological:     General: No focal deficit present.     Mental Status: He is alert.  Psychiatric:        Mood and Affect: Mood normal.     ED Results / Procedures / Treatments   Labs (all labs ordered are listed, but only abnormal results are displayed) Labs Reviewed  URINALYSIS, ROUTINE W REFLEX MICROSCOPIC - Abnormal; Notable for the following components:      Result Value   Glucose, UA >=500 (*)    Hgb urine dipstick TRACE (*)    Protein, ur 100 (*)    All other components within normal limits  URINALYSIS, MICROSCOPIC (REFLEX) - Abnormal; Notable for the following components:   Bacteria, UA RARE (*)    All other components within normal limits  CBG MONITORING, ED - Abnormal; Notable for the following components:   Glucose-Capillary 295 (*)    All other components within normal limits  GC/CHLAMYDIA PROBE AMP ([place]) NOT AT [facility]    EKG None  Radiology No results found.  Procedures Procedures    Medications Ordered in ED Medications  cefTRIAXone (ROCEPHIN) injection 500 mg (500 mg Intramuscular Given 03/15/22 1140)  lidocaine (PF) (XYLOCAINE) 1 % injection (1 mL  Given 03/15/22 1141)    ED Course/ Medical Decision Making/ A&P                           Medical Decision Making Amount and/or Complexity of Data Reviewed Labs: ordered.  Risk Prescription drug management.   36 year old male presents emergency department for evaluation of penile discharge x2 days.  Differentials include STD, UTI.  Blood pressure noted to be high although vitals are otherwise without significant abnormality. On  exam, patient is well-appearing and in no acute distress.  He has mild irritation around the urethra with clear discharge observed.  I ordered and interpreted urinalysis which shows some white blood cells without obvious evidence of urinary tract infection.  Given his symptoms and UA findings, most likely has underlying STD.  GC chlamydia test is pending.  Plan to treat here empirically with 500 mg Rocephin IM plus doxycycline twice daily x7 days.  Patient advised to download MyChart to monitor for results although will likely receive  call if anything is positive.  If symptoms persist after completion of antibiotic therapy, he can return for repeat urinalysis and recheck for UTI.  Patient expresses understanding and is amenable to plan.  Discharged home in good condition. Final Clinical Impression(s) / ED Diagnoses Final diagnoses:  Exposure to sexually transmitted disease (STD)    Rx / DC Orders ED Discharge Orders          Ordered    doxycycline (VIBRAMYCIN) 100 MG capsule  2 times daily        03/15/22 1133              Tonye Pearson, [place] 03/15/22 1158    Lajean Saver, MD 03/15/22 1551

## 2022-03-15 NOTE — Discharge Instructions (Signed)
Your urine today was negative for infection although there was large amounts of white blood cells which is suspicious for an underlying sexually transmitted disease.  I have given you a dose of medication here in the emergency department and you will pick up the secondary medication called doxycycline at your pharmacy.  Take this twice daily for 1 week.  Please complete the entire treatment.  You have downloaded MyChart and you should get the results of your gonorrhea chlamydia in the next day or 2-if they do return positive, you have already been treated and there is no further need for follow-up.  If your symptoms persist after you have completed the 7 days of treatment, feel free to return for urinalysis recheck.

## 2022-03-17 LAB — GC/CHLAMYDIA PROBE AMP (~~LOC~~) NOT AT ARMC
Chlamydia: NEGATIVE
Comment: NEGATIVE
Comment: NORMAL
Neisseria Gonorrhea: POSITIVE — AB

## 2022-04-20 ENCOUNTER — Emergency Department (HOSPITAL_BASED_OUTPATIENT_CLINIC_OR_DEPARTMENT_OTHER)
Admission: EM | Admit: 2022-04-20 | Discharge: 2022-04-20 | Disposition: A | Discharge status: Home / Self Care | Payer: Self-pay | Attending: Emergency Medicine | Admitting: Emergency Medicine

## 2022-04-20 ENCOUNTER — Encounter (HOSPITAL_BASED_OUTPATIENT_CLINIC_OR_DEPARTMENT_OTHER): Payer: Self-pay | Admitting: Emergency Medicine

## 2022-04-20 DIAGNOSIS — Z79899 Other long term (current) drug therapy: Secondary | ICD-10-CM | POA: Insufficient documentation

## 2022-04-20 DIAGNOSIS — Z7984 Long term (current) use of oral hypoglycemic drugs: Secondary | ICD-10-CM | POA: Insufficient documentation

## 2022-04-20 DIAGNOSIS — I10 Essential (primary) hypertension: Secondary | ICD-10-CM | POA: Insufficient documentation

## 2022-04-20 DIAGNOSIS — E1165 Type 2 diabetes mellitus with hyperglycemia: Secondary | ICD-10-CM | POA: Insufficient documentation

## 2022-04-20 DIAGNOSIS — R739 Hyperglycemia, unspecified: Secondary | ICD-10-CM

## 2022-04-20 DIAGNOSIS — Z794 Long term (current) use of insulin: Secondary | ICD-10-CM | POA: Insufficient documentation

## 2022-04-20 LAB — BASIC METABOLIC PANEL
Anion gap: 5 (ref 5–15)
BUN: 14 mg/dL (ref 6–20)
CO2: 30 mmol/L (ref 22–32)
Calcium: 9.3 mg/dL (ref 8.9–10.3)
Chloride: 101 mmol/L (ref 98–111)
Creatinine, Ser: 1.22 mg/dL (ref 0.61–1.24)
GFR, Estimated: >60 mL/min (ref >60)
Glucose, Bld: 312 mg/dL — ABNORMAL HIGH (ref 70–99)
Potassium: 5.1 mmol/L (ref 3.5–5.1)
Sodium: 136 mmol/L (ref 135–145)

## 2022-04-20 LAB — URINALYSIS, ROUTINE W REFLEX MICROSCOPIC
Bilirubin Urine: NEGATIVE
Glucose, UA: >=500 mg/dL — AB
Hgb urine dipstick: NEGATIVE
Ketones, ur: NEGATIVE mg/dL
Leukocytes,Ua: NEGATIVE
Nitrite: NEGATIVE
Protein, ur: 100 mg/dL — AB
Specific Gravity, Urine: 1.015 (ref 1.005–1.030)
pH: 7 (ref 5.0–8.0)

## 2022-04-20 LAB — CBC
HCT: 43.7 % (ref 39.0–52.0)
Hemoglobin: 15.2 g/dL (ref 13.0–17.0)
MCH: 28.9 pg (ref 26.0–34.0)
MCHC: 34.8 g/dL (ref 30.0–36.0)
MCV: 83.1 fL (ref 80.0–100.0)
Platelets: 281 10*3/uL (ref 150–400)
RBC: 5.26 MIL/uL (ref 4.22–5.81)
RDW: 12.9 % (ref 11.5–15.5)
WBC: 4.6 10*3/uL (ref 4.0–10.5)
nRBC: 0 % (ref 0.0–0.2)

## 2022-04-20 LAB — CBG MONITORING, ED: Glucose-Capillary: 294 mg/dL — ABNORMAL HIGH (ref 70–99)

## 2022-04-20 LAB — URINALYSIS, MICROSCOPIC (REFLEX)

## 2022-04-20 LAB — TROPONIN I (HIGH SENSITIVITY)
Troponin I (High Sensitivity): 10 ng/L (ref <18)
Troponin I (High Sensitivity): 11 ng/L (ref <18)

## 2022-04-20 MED ORDER — HYDRALAZINE HCL 20 MG/ML IJ SOLN
10.0000 mg | Freq: Once | INTRAMUSCULAR | Status: AC
  Administered 2022-04-20: 10 mg via INTRAVENOUS
  Filled 2022-04-20: qty 1

## 2022-04-20 MED ORDER — METFORMIN HCL 500 MG PO TABS: 500.0000 mg | ORAL_TABLET | Freq: Two times a day (BID) | ORAL | 1 refills | Status: AC

## 2022-04-20 MED ORDER — SODIUM CHLORIDE 0.9 % IV BOLUS
1000.0000 mL | Freq: Once | INTRAVENOUS | Status: AC
  Administered 2022-04-20: 1000 mL via INTRAVENOUS

## 2022-04-20 MED ORDER — AMLODIPINE BESYLATE 5 MG PO TABS: 5.0000 mg | ORAL_TABLET | Freq: Every day | ORAL | 1 refills | Status: AC

## 2022-04-20 MED ORDER — AMLODIPINE BESYLATE 5 MG PO TABS
5.0000 mg | ORAL_TABLET | Freq: Once | ORAL | Status: AC
  Administered 2022-04-20: 5 mg via ORAL
  Filled 2022-04-20: qty 1

## 2022-04-20 NOTE — Discharge Instructions (Addendum)
Your heart labs look normal today.  The only thing noted on your work-up is elevated blood sugar and elevated blood pressure.    The prescriptions I have attached are the following 1.  Amlodipine.  This is for hypertension. 2.  Metformin.  This is for diabetes.  These prescriptions are printed and you may take them to any pharmacy of your choosing.  Use goodRx for the past years.  It is important for you to treat these chronic illnesses to avoid severe events like heart attacks and strokes.

## 2022-04-20 NOTE — ED Triage Notes (Addendum)
Pt reports dizziness for the past few days. Had an episode of LOC this morning. Dizziness worse when sitting down, moving head or standing up. No n/v/d or other symptoms. Denies any pain or injury.

## 2022-04-20 NOTE — ED Provider Notes (Signed)
[facility] EMERGENCY DEPARTMENT Provider Note   CSN: [id number] Arrival date & time: 04/20/22  1202     History  Chief Complaint  Patient presents with   Loss of Consciousness    Jimmy Jordan is a 36 y.o. male with a past medical history of type 2 diabetes and hypertension presenting today with the complaint of syncope.  He reports for the past 2 days he has felt lightheaded.  Says that he does not feel it at rest but when he stands up or turns his head from side to side he feels dizzy.  Today he believes he lost consciousness and ended up on the ground.  Reports that he does not take metformin or insulin for his diabetes.  Is having trouble obtaining them due to cost.  Also says he has never been diagnosed with hypertension so he is never been on medications.  Currently asymptomatic.  Loss of Consciousness      Home Medications Prior to Admission medications   Medication Sig Start Date End Date Taking? Authorizing Provider  albuterol (PROVENTIL HFA;VENTOLIN HFA) 108 (90 BASE) MCG/ACT inhaler Inhale 1-2 puffs into the lungs every 6 (six) hours as needed for wheezing or shortness of breath. 07/07/15   Theodis Blaze, MD  cyclobenzaprine (FLEXERIL) 10 MG tablet Take 1 tablet (10 mg total) by mouth 2 (two) times daily as needed for muscle spasms. 02/27/21   Dorie Rank, MD  doxycycline (VIBRAMYCIN) 100 MG capsule Take 1 capsule (100 mg total) by mouth 2 (two) times daily. 03/15/22   Tonye Pearson, PA-C  fluticasone (FLONASE) 50 MCG/ACT nasal spray Place 2 sprays into both nostrils daily. Patient taking differently: Place 2 sprays into both nostrils daily as needed for allergies.  08/21/14   Rolene Course, PA-C  glucose blood (TRUE METRIX BLOOD GLUCOSE TEST) test strip 1 each by Other route 2 (two) times daily. 07/12/15   Funches, Adriana Mccallum, MD  glucose monitoring kit (FREESTYLE) monitoring kit 1 each by Does not apply route as needed for other. 11/17/14   Kelvin Cellar, MD   HYDROcodone-acetaminophen (NORCO/VICODIN) 5-325 MG tablet Take 1 tablet by mouth every 6 (six) hours as needed. 02/27/21   Dorie Rank, MD  insulin aspart protamine- aspart (NOVOLOG MIX 70/30) (70-30) 100 UNIT/ML injection Inject 0.3 mLs (30 Units total) into the skin 2 (two) times daily with a meal. 02/27/21   Dorie Rank, MD  Insulin Syringe-Needle U-100 (B-D INS SYRINGE 0.5CC/31GX5/16) 31G X 5/16" 0.5 ML MISC 1 each by Does not apply route 3 (three) times daily. 07/12/15   Funches, Adriana Mccallum, MD  metFORMIN (GLUCOPHAGE) 500 MG tablet Take 500 mg by mouth 2 (two) times daily with a meal.    [provider]  methocarbamol (ROBAXIN) 500 MG tablet Take 1 tablet (500 mg total) by mouth every 8 (eight) hours as needed for muscle spasms. 07/12/15   Funches, Adriana Mccallum, MD  naproxen (NAPROSYN) 500 MG tablet Take 1 tablet (500 mg total) by mouth 2 (two) times daily as needed for moderate pain. 03/05/21   Antonietta Breach, PA-C  sitaGLIPtin (JANUVIA) 100 MG tablet Take 1 tablet (100 mg total) by mouth daily. 07/12/15   Funches, Adriana Mccallum, MD  TRUEPLUS LANCETS 28G MISC 1 each by Does not apply route 3 (three) times daily. 07/12/15   Boykin Nearing, MD      Allergies    Penicillins    Review of Systems   Review of Systems  Cardiovascular:  Positive for syncope.  Physical Exam Updated Vital Signs BP (!) 165/109 (BP Location: Right Arm)   Pulse 90   Temp 97.8 F (36.6 C) (Oral)   Resp 18   SpO2 100%  Physical Exam Vitals and nursing note reviewed.  Constitutional:      Appearance: Normal appearance.  HENT:     Head: Normocephalic and atraumatic.  Eyes:     General: No scleral icterus.    Conjunctiva/sclera: Conjunctivae normal.  Pulmonary:     Effort: Pulmonary effort is normal. No respiratory distress.  Skin:    Findings: No rash.  Neurological:     Mental Status: He is alert.     Comments: Cranial nerves II through XII grossly intact.  No problem with finger-nose.  PERRLA and EOMs  within normal limits.  5 out of 5 strength in bilateral upper and lower extremities  Psychiatric:        Mood and Affect: Mood normal.     ED Results / Procedures / Treatments   Labs (all labs ordered are listed, but only abnormal results are displayed) Labs Reviewed  URINALYSIS, ROUTINE W REFLEX MICROSCOPIC - Abnormal; Notable for the following components:      Result Value   Glucose, UA >=500 (*)    Protein, ur 100 (*)    All other components within normal limits  URINALYSIS, MICROSCOPIC (REFLEX) - Abnormal; Notable for the following components:   Bacteria, UA RARE (*)    All other components within normal limits  CBG MONITORING, ED - Abnormal; Notable for the following components:   Glucose-Capillary 294 (*)    All other components within normal limits  CBC  BASIC METABOLIC PANEL  TROPONIN I (HIGH SENSITIVITY)    EKG EKG Interpretation  Date/Time:  Sunday April 20 2022 12:12:43 EDT Ventricular Rate:  95 PR Interval:  140 QRS Duration: 88 QT Interval:  344 QTC Calculation: 432 R Axis:   69 Text Interpretation: Normal sinus rhythm Normal ECG When compared with ECG of 03-Nov-2015 19:29, anterior ST elevation more prominent Confirmed by Aletta Edouard [phone]) on 04/20/2022 12:18:37 PM  Radiology No results found.  Procedures Procedures   Medications Ordered in ED Medications  sodium chloride 0.9 % bolus 1,000 mL ( Intravenous Stopped 04/20/22 1422)  hydrALAZINE (APRESOLINE) injection 10 mg (10 mg Intravenous Given 04/20/22 1323)  amLODipine (NORVASC) tablet 5 mg (5 mg Oral Given 04/20/22 1552)    ED Course/ Medical Decision Making/ A&P                           Medical Decision Making Amount and/or Complexity of Data Reviewed Labs: ordered.  Risk Prescription drug management.   This patient presents to the ED for concern of lightheadedness and syncope.  Differential includes but is not limited to ACS, PE, hypoglycemia, hypotension, orthostasis, brain mass.    This is not an exhaustive differential.    Past Medical History / Co-morbidities / Social History: Diabetes and hypertension   Additional history: Per chart review patient has had visits with internal medicine about uncontrolled type 2 diabetes.  He has been hypertensive on all of his   Physical Exam: Normal neurologic exam.  Tech did note urinary mild orthostasis when standing from a seated position.  Exactly 20 point change in systolic BP  Lab Tests: I ordered, and personally interpreted labs.  The pertinent results include:  -Glucose 312, normal anion gap and bicarb -Glucosuria -Negative troponin x2   Imaging Studies: Considered CT head  however neurologic exam completely normal.   Cardiac Monitoring:  The patient was maintained on a cardiac monitor.  My attending physician Dr. Melina Copa reviewed and interpreted this.  Patient remained in sinus rhythm.  Original EKG with suspicious for MI however there were no reciprocal changes to the anterior elevations.  Troponins were ordered which were negative   Medications: I ordered medication including amlodipine and metformin. Reevaluation of the patient after these medicines showed that the patient improved. I have reviewed the patients home medicines and have made adjustments as needed.   Disposition: 36 year old male presented today with syncope and lightheadedness.  He was noted to be orthostatic.  Also has a history of diabetes for which she is not taking medications.  Further, he denies a history of hypertension however it is listed multiple times in his chart.  No signs of ACS, his blood sugar is elevated but without signs of DKA/HHS.  He was given fluids for his orthostasis and hydralazine and amlodipine for blood pressure.  At this time, he does not appear to have an emergent condition that needs to be evaluated further today.  Neurologically intact.  He will be sent with written prescription for metformin and amlodipine.  He will  take these to any pharmacy that is more affordable.  Also given referral to low-cost PCPs.  All questions were answered and he will be discharged at this time.     I discussed this case with my attending physician Dr. Melina Copa who cosigned this note including patient's presenting symptoms, physical exam, and planned diagnostics and interventions. Attending physician stated agreement with plan or made changes to plan which were implemented.      Final Clinical Impression(s) / ED Diagnoses Final diagnoses:  Hyperglycemia  Hypertension, unspecified type    Rx / DC Orders ED Discharge Orders          Ordered    amLODipine (NORVASC) 5 MG tablet  Daily        04/20/22 1545    metFORMIN (GLUCOPHAGE) 500 MG tablet  2 times daily with meals        04/20/22 1545           Results and diagnoses were explained to the patient. Return precautions discussed in full. Patient had no additional questions and expressed complete understanding.   This chart was dictated using voice recognition software.  Despite best efforts to proofread,  errors can occur which can change the documentation meaning.     Rhae Hammock, PA-C 04/20/22 1557    Hayden Rasmussen, MD 04/20/22 1721

## 2022-09-01 IMAGING — CR DG LUMBAR SPINE COMPLETE 4+V
5 series · 5 of 5 positions shown · non-contrast
Comparison: 06/25/2015

CLINICAL DATA: Low back pain

EXAM:
LUMBAR SPINE - COMPLETE 4+ VIEW

[l-spine ap]
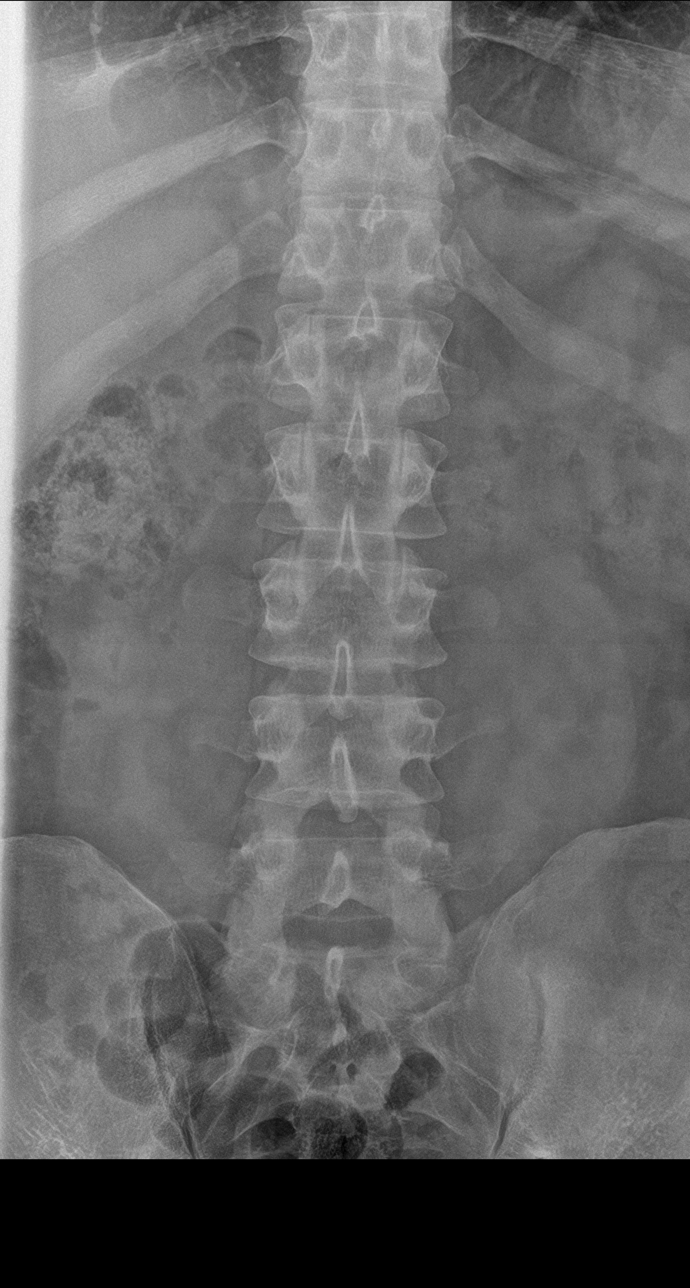

[l-spine obl (1 of 2)]
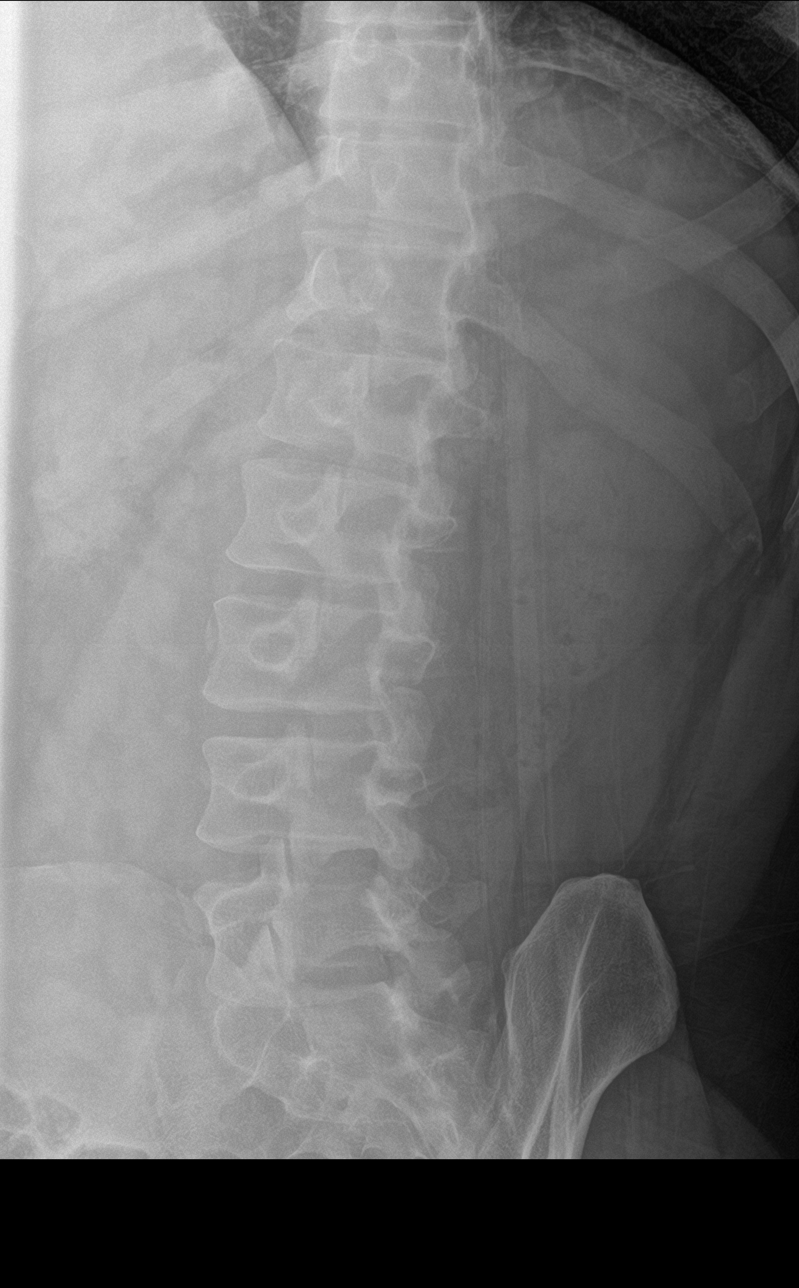

[l-spine obl (2 of 2)]
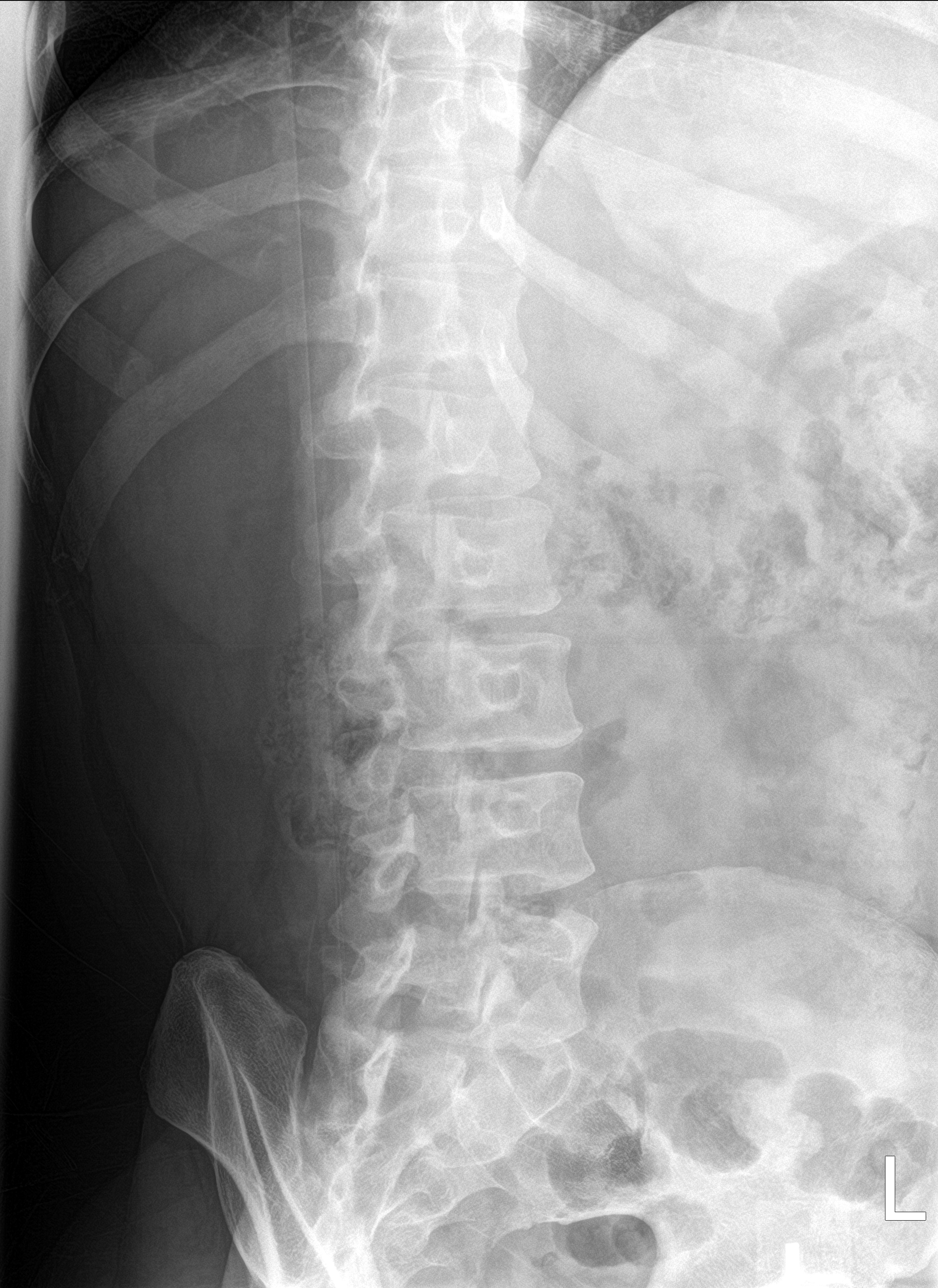

[l-spine lat]
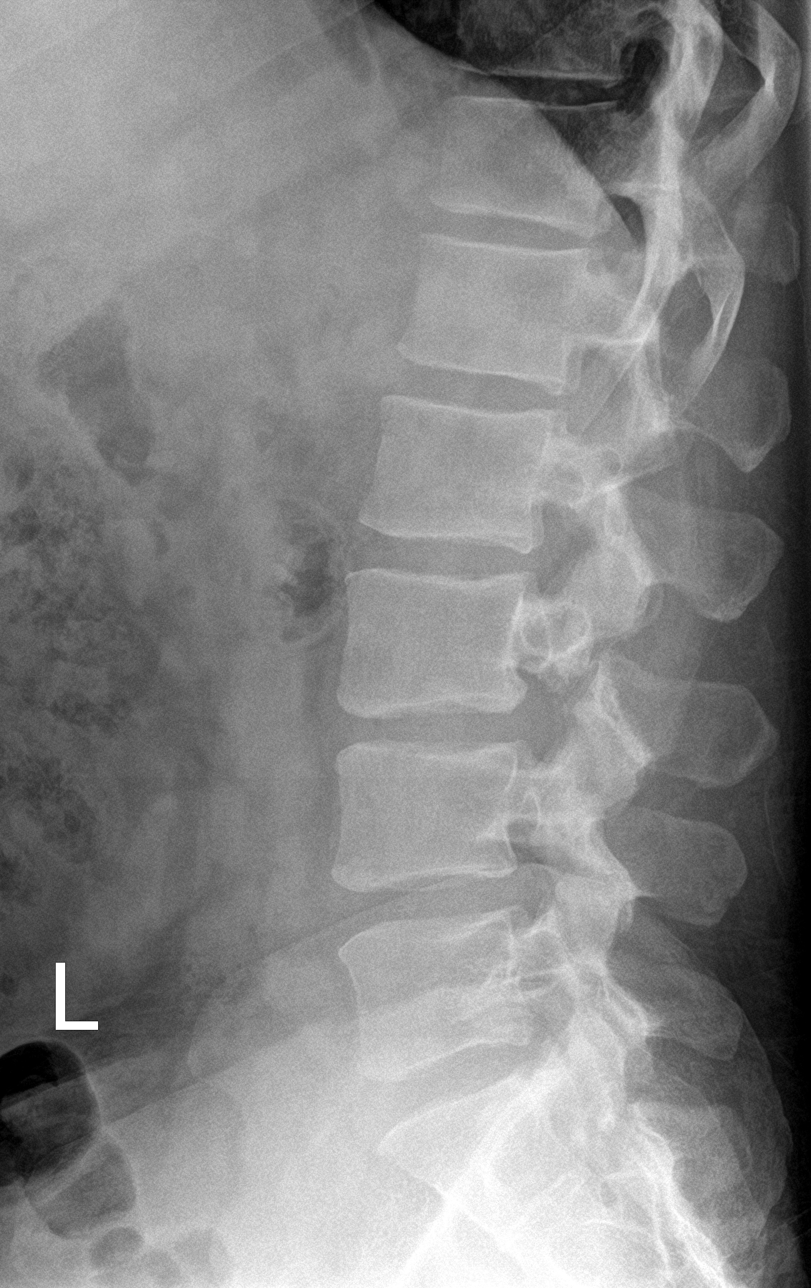

[l-spine spot]
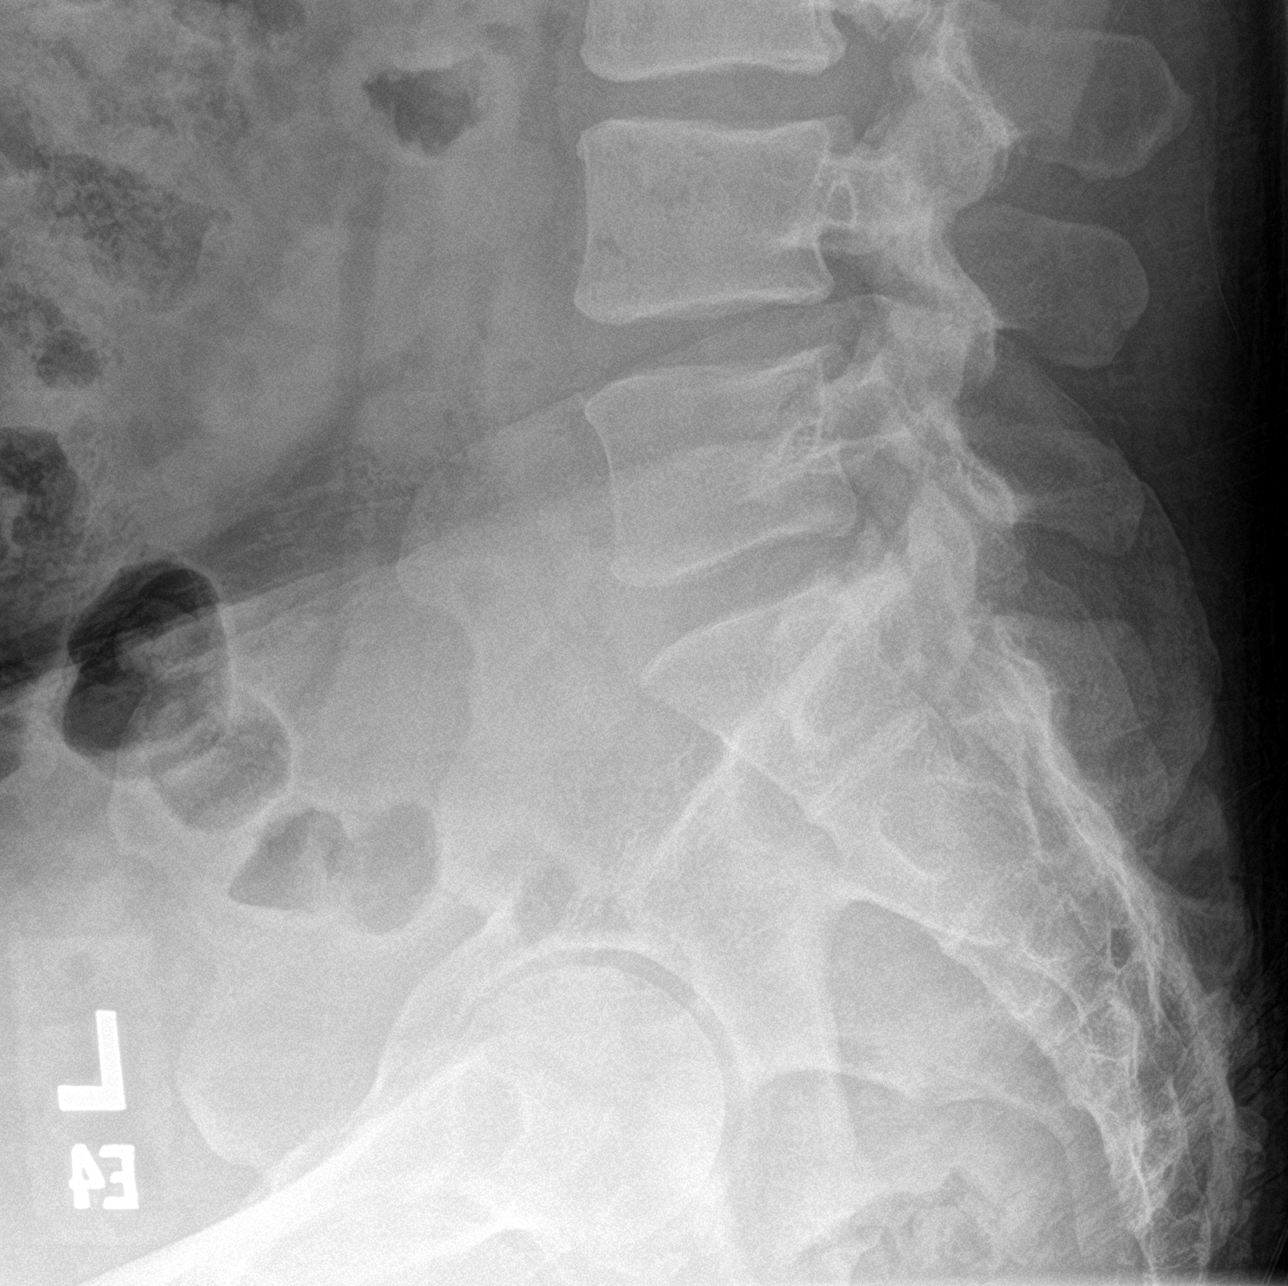

[5 of 5 positions shown; findings below may reference images not displayed]

FINDINGS: Lumbar alignment within normal limits. Vertebral body heights and
disc spaces appear normal. Chronic bilateral pars defect at L3
IMPRESSION: Chronic bilateral pars defect at L3.  Otherwise negative

## 2024-09-12 ENCOUNTER — Encounter (HOSPITAL_BASED_OUTPATIENT_CLINIC_OR_DEPARTMENT_OTHER): Payer: Self-pay

## 2024-09-12 ENCOUNTER — Other Ambulatory Visit: Payer: Self-pay

## 2024-09-12 ENCOUNTER — Emergency Department (HOSPITAL_BASED_OUTPATIENT_CLINIC_OR_DEPARTMENT_OTHER)

## 2024-09-12 ENCOUNTER — Emergency Department (HOSPITAL_BASED_OUTPATIENT_CLINIC_OR_DEPARTMENT_OTHER)
Admission: EM | Admit: 2024-09-12 | Discharge: 2024-09-12 | Disposition: A | Discharge status: Home / Self Care | Attending: Emergency Medicine | Admitting: Emergency Medicine

## 2024-09-12 ENCOUNTER — Other Ambulatory Visit (HOSPITAL_BASED_OUTPATIENT_CLINIC_OR_DEPARTMENT_OTHER): Payer: Self-pay

## 2024-09-12 DIAGNOSIS — Z794 Long term (current) use of insulin: Secondary | ICD-10-CM | POA: Diagnosis not present

## 2024-09-12 DIAGNOSIS — E1122 Type 2 diabetes mellitus with diabetic chronic kidney disease: Secondary | ICD-10-CM | POA: Insufficient documentation

## 2024-09-12 DIAGNOSIS — N189 Chronic kidney disease, unspecified: Secondary | ICD-10-CM | POA: Diagnosis not present

## 2024-09-12 DIAGNOSIS — Z760 Encounter for issue of repeat prescription: Secondary | ICD-10-CM | POA: Diagnosis not present

## 2024-09-12 DIAGNOSIS — Z7984 Long term (current) use of oral hypoglycemic drugs: Secondary | ICD-10-CM | POA: Diagnosis not present

## 2024-09-12 DIAGNOSIS — M79675 Pain in left toe(s): Secondary | ICD-10-CM | POA: Diagnosis present

## 2024-09-12 DIAGNOSIS — M109 Gout, unspecified: Secondary | ICD-10-CM | POA: Insufficient documentation

## 2024-09-12 HISTORY — DX: Pure hypercholesterolemia, unspecified: E78.00

## 2024-09-12 MED ORDER — LOSARTAN POTASSIUM 25 MG PO TABS
100.0000 mg | ORAL_TABLET | ORAL | Status: AC
  Administered 2024-09-12: 100 mg via ORAL
  Filled 2024-09-12: qty 4

## 2024-09-12 MED ORDER — FUROSEMIDE 20 MG PO TABS
20.0000 mg | ORAL_TABLET | Freq: Every day | ORAL | 0 refills | Status: AC
  Filled 2024-09-12: qty 7, 7d supply, fill #0

## 2024-09-12 MED ORDER — PREDNISONE 20 MG PO TABS
40.0000 mg | ORAL_TABLET | Freq: Once | ORAL | Status: AC
  Administered 2024-09-12: 40 mg via ORAL
  Filled 2024-09-12: qty 2

## 2024-09-12 MED ORDER — FUROSEMIDE 40 MG PO TABS: 50.0000 mg | ORAL_TABLET | ORAL | Status: DC

## 2024-09-12 MED ORDER — OXYCODONE HCL 5 MG PO TABS
5.0000 mg | ORAL_TABLET | ORAL | 0 refills | Status: AC | PRN
  Filled 2024-09-12: qty 7, 2d supply, fill #0

## 2024-09-12 MED ORDER — FUROSEMIDE 20 MG PO TABS
20.0000 mg | ORAL_TABLET | Freq: Once | ORAL | Status: AC
  Administered 2024-09-12: 20 mg via ORAL
  Filled 2024-09-12: qty 1

## 2024-09-12 MED ORDER — PREDNISONE 20 MG PO TABS
40.0000 mg | ORAL_TABLET | Freq: Every day | ORAL | 0 refills | Status: AC
  Filled 2024-09-12: qty 10, 5d supply, fill #0

## 2024-09-12 NOTE — Discharge Instructions (Addendum)
 You were seen in the ER today for evaluation. You appear to have a flare of your gout. For this, we will treat with prednisone . Your first dose will be tomorrow. You will need to make sure that you are checking your blood sugar regularly as this can elevate this. Make sure that you are mindful of what food you are eating! I have also refilled your water pill for you. Please take as directed. I have included some additional information for you to review. Additionally, you have some calcifications of your vessels which is advanced for your age, please make sure that you follow up with your PCP on this. If you have any concerns, new or worsening symptoms, please return to the ER for re-evaluation.   For pain, recommend taking at 1000 mg of Tylenol  every 6 hours as needed.  For breakthrough pain, I prescribed you a few narcotic pain medications.  Please do not drive or operate any heavy machinery while on this medication as it will make you sleepy.  Contact a doctor if: You have another gout attack. You still have symptoms of a gout attack after 10 days of treatment. You have problems (side effects) because of your medicines. You have chills or a fever. You have burning pain when you pee (urinate). You have pain in your lower back or belly. Get help right away if: You have very bad pain. Your pain cannot be controlled. You cannot pee.

## 2024-09-12 NOTE — ED Triage Notes (Addendum)
 Reports gout flare up for 1 week. L big toe pain, swelling noted  Has not taken BP meds today-- denies headache, blurry vision, chest pain

## 2024-09-12 NOTE — ED Notes (Addendum)
 Pt reviewed discharge instructions, follow up and medications to pick up. EDP aware of BP. Pt states understanding. Denies Cp, HA or SOB at this time. Ambulatory at discharge

## 2024-09-12 NOTE — ED Provider Notes (Signed)
 " [place] EMERGENCY DEPARTMENT AT [facility] [place] Provider Note   CSN: [id number] Arrival date & time: 09/12/24  9150     Patient presents with: Toe Pain   Jimmy Jordan is a 38 y.o. male with h/o CKD, DM, and gout presents to the ER today for evaluation of gout flare in his left foot for the past week. Denies any fevers. He reports that this is always were his gout flares and feels like his previous presentations with this. Denies any trauma. He reports that his provider treats this with prednisone  back at home. He currently in on Trulicity instead of insulin . Reports the hightest his glucose was this week was 204 at Christmas. He also did not take his BP medication this morning. He is visiting from [place] and forgot his Lasix  at home. Denies any chest pain or SOB. Reports he has been having some mild bilateral leg swelling that he attributes to not taking his water pill for over 5 days. Allergic to PCN.    Toe Pain Pertinent negatives include no chest pain and no shortness of breath.       Prior to Admission medications  Medication Sig Start Date End Date Taking? Authorizing Provider  furosemide  (LASIX ) 20 MG tablet Take 20 mg by mouth daily. 08/16/24  Yes [provider]  furosemide  (LASIX ) 20 MG tablet Take 1 tablet (20 mg total) by mouth daily. 09/12/24  Yes Bernis Ernst, PA-C  losartan  (COZAAR ) 100 MG tablet Take 100 mg by mouth daily. 09/12/24  Yes [provider]  metoprolol succinate (TOPROL-XL) 25 MG 24 hr tablet Take 25 mg by mouth daily. 09/03/24  Yes [provider]  oxyCODONE  (ROXICODONE ) 5 MG immediate release tablet Take 1 tablet (5 mg total) by mouth every 4 (four) hours as needed for severe pain (pain score 7-10). 09/12/24  Yes Bernis Ernst, PA-C  predniSONE  (DELTASONE ) 20 MG tablet Take 2 tablets (40 mg total) by mouth daily. 09/12/24  Yes Bernis Ernst, PA-C  rosuvastatin (CRESTOR) 10 MG tablet Take 10 mg by mouth at bedtime.  09/12/24  Yes [provider]  TRULICITY 3 MG/0.5ML SOAJ Inject 3 mg into the skin once a week. 08/02/24  Yes [provider]  albuterol  (PROVENTIL  HFA;VENTOLIN  HFA) 108 (90 BASE) MCG/ACT inhaler Inhale 1-2 puffs into the lungs every 6 (six) hours as needed for wheezing or shortness of breath. 07/07/15   Billy Junnie HERO, MD  amLODipine  (NORVASC ) 5 MG tablet Take 1 tablet (5 mg total) by mouth daily. 04/20/22   Redwine, Madison A, PA-C  cyclobenzaprine  (FLEXERIL ) 10 MG tablet Take 1 tablet (10 mg total) by mouth 2 (two) times daily as needed for muscle spasms. 02/27/21   Randol Simmonds, MD  doxycycline  (VIBRAMYCIN ) 100 MG capsule Take 1 capsule (100 mg total) by mouth 2 (two) times daily. 03/15/22   Conklin, Erica R, PA-C  fluticasone  (FLONASE ) 50 MCG/ACT nasal spray Place 2 sprays into both nostrils daily. Patient taking differently: Place 2 sprays into both nostrils daily as needed for allergies.  08/21/14   Aniceto Pfeiffer, PA-C  glucose blood (TRUE METRIX BLOOD GLUCOSE TEST) test strip 1 each by Other route 2 (two) times daily. 07/12/15   Funches, Josalyn, MD  glucose monitoring kit (FREESTYLE) monitoring kit 1 each by Does not apply route as needed for other. 11/17/14   Zamora, Ezequiel, MD  insulin  aspart protamine- aspart (NOVOLOG  MIX 70/30) (70-30) 100 UNIT/ML injection Inject 0.3 mLs (30 Units total) into the skin 2 (  two) times daily with a meal. 02/27/21   Randol Simmonds, MD  Insulin  Syringe-Needle U-100 (B-D INS SYRINGE 0.5CC/31GX5/16) 31G X 5/16 0.5 ML MISC 1 each by Does not apply route 3 (three) times daily. 07/12/15   Funches, Josalyn, MD  metFORMIN  (GLUCOPHAGE ) 500 MG tablet Take 1 tablet (500 mg total) by mouth 2 (two) times daily with a meal. 04/20/22 06/19/22  Redwine, Madison A, PA-C  methocarbamol  (ROBAXIN ) 500 MG tablet Take 1 tablet (500 mg total) by mouth every 8 (eight) hours as needed for muscle spasms. 07/12/15   Funches, Josalyn, MD  naproxen  (NAPROSYN ) 500 MG tablet Take 1  tablet (500 mg total) by mouth 2 (two) times daily as needed for moderate pain. 03/05/21   Keith Sor, PA-C  sitaGLIPtin  (JANUVIA ) 100 MG tablet Take 1 tablet (100 mg total) by mouth daily. 07/12/15   Funches, Josalyn, MD  TRUEPLUS LANCETS 28G MISC 1 each by Does not apply route 3 (three) times daily. 07/12/15   Funches, Josalyn, MD    Allergies: Penicillins    Review of Systems  Constitutional:  Negative for chills and fever.  Respiratory:  Negative for shortness of breath.   Cardiovascular:  Positive for leg swelling (bilateral). Negative for chest pain.  Musculoskeletal:  Positive for arthralgias.    Updated Vital Signs BP (!) 190/123   Pulse 94   Temp 98.3 F (36.8 C)   Resp 16   Ht 6' 1 (1.854 m)   Wt 124.7 kg   SpO2 97%   BMI 36.28 kg/m   Physical Exam Vitals and nursing note reviewed.  Constitutional:      General: He is not in acute distress.    Appearance: He is not ill-appearing or toxic-appearing.  Eyes:     General: No scleral icterus. Cardiovascular:     Pulses:          Dorsalis pedis pulses are 2+ on the left side.       Posterior tibial pulses are 2+ on the left side.     Comments: Trace bilateral lower edema. Compartments are soft.  Pulmonary:     Effort: Pulmonary effort is normal. No respiratory distress.  Musculoskeletal:     Right lower leg: Edema present.     Left lower leg: Edema present.  Feet:     Comments: Some swelling and increase in erythema and warmth near the first MTP of the left foot.  Consistent with podagra.  Brisk cap refill present in all 5 toes.  Palpable DP and PT pulses.  Compartments are soft.  Does have some trace edema to the lower extremities.  No red streaking noted.  No wounds or open skin breaks noted throughout the foot or in between the toes.  Sensation reportedly intact per patient.  Can still wiggle toes. Skin:    General: Skin is warm and dry.  Neurological:     Mental Status: He is alert.     (all labs ordered  are listed, but only abnormal results are displayed) Labs Reviewed - No data to display  EKG: None  Radiology: DG Foot Complete Left Result Date: 09/12/2024 EXAM: 3 OR MORE VIEW(S) XRAY OF THE LEFT FOOT 09/12/2024 10:02:00 AM COMPARISON: None available. CLINICAL HISTORY: pain FINDINGS: BONES AND JOINTS: No acute fracture. No malalignment. Chronic fragmented osteophytes laterally along the head of the proximal phalanx and head of the metatarsal of the 1st digit. Degenerative subcortical cystic lesion or geode in the base of the proximal phalanx great toe. Possible  medial chronic erosion of the head of the proximal phalanx great toe, which can be seen in gout arthropathy. SOFT TISSUES: Faint atheromatous calcifications noted along the ankle and forefoot, advanced for age. Dorsal subcutaneous edema along the forefoot. No significant abnormal gas identified in the soft tissues. IMPRESSION: 1. Suspected chronic medial erosion of the head of the proximal phalanx of the great toe, which can be seen with gout arthropathy. 2. Dorsal subcutaneous edema along the forefoot. 3. Degenerative subcortical cystic change (geode) at the base of the proximal phalanx of the great toe. 4. Chronic fragmented osteophytes at the lateral aspect of the first metatarsophalangeal joint. 5. Incidental atheromatous vascular calcifications in the ankle and forefoot, advanced for age. Electronically signed by: Ryan Salvage MD 09/12/2024 11:26 AM EST RP Workstation: [id number]    Procedures   Medications Ordered in the ED  losartan  (COZAAR ) tablet 100 mg (100 mg Oral Given 09/12/24 1103)  predniSONE  (DELTASONE ) tablet 40 mg (40 mg Oral Given 09/12/24 1103)  furosemide  (LASIX ) tablet 20 mg (20 mg Oral Given 09/12/24 1102)    Medical Decision Making Amount and/or Complexity of Data Reviewed Radiology: ordered.  Risk Prescription drug management.   38 y.o. male presents to the ER for evaluation of left medial great toe  joint pain. Differential diagnosis includes but is not limited to septic arthritis, gout, pseudogout, cellulitis. Vital signs elevated blood pressure otherwise unremarkable. Physical exam as noted above.   XR foot shows 1. Suspected chronic medial erosion of the head of the proximal phalanx of the great toe, which can be seen with gout arthropathy. 2. Dorsal subcutaneous edema along the forefoot. 3. Degenerative subcortical cystic change (geode) at the base of the proximal phalanx of the great toe. 4. Chronic fragmented osteophytes at the lateral aspect of the first metatarsophalangeal joint. 5. Incidental atheromatous vascular calcifications in the ankle and forefoot, advanced for age. Per radiologist's interpretation.    I have given the patient dose of losartan  which is his home blood pressure medication.  Of also given him a dose of prednisone  here as well as a dose of his at home furosemide .  Will send him home with a weeks worth of the furosemide  to last until he gets back to [place].  Had a shared decision making with the patient.  Given he cannot have any anti-inflammatories and do feel you benefit from some sort of anti-inflammatory benefit, we will give him prednisone  as this is what he has received in the past.  Discussed with him that it is very important to be checking his blood sugars as this can make his blood sugars rise.  Discussed that he needs to be eating well during this time as well discussed checking his blood sugars regularly.  He verbalizes understanding and agrees.  This is a classic presentation of podagra.  Patient reports this is classically where he gets his gout.  I doubt any septic arthritis.  Atraumatic.  He is asymptomatic from his high blood pressure.  Likely from not taking his medications.  He is not having any headaches, chest pain, or shortness of breath.  Will discharge home with strict return precautions.  We discussed the results of the labs/imaging. The plan is take  medication, follow-up PCP, take blood sugars. We discussed strict return precautions and red flag symptoms. The patient verbalized their understanding and agrees to the plan. The patient is stable and being discharged home in good condition.  Portions of this report may have been transcribed using voice recognition  software. Every effort was made to ensure accuracy; however, inadvertent computerized transcription errors may be present.    Final diagnoses:  Podagra  Medication refill    ED Discharge Orders          Ordered    furosemide  (LASIX ) 20 MG tablet  Daily        09/12/24 1054    predniSONE  (DELTASONE ) 20 MG tablet  Daily        09/12/24 1054    oxyCODONE  (ROXICODONE ) 5 MG immediate release tablet  Every 4 hours PRN        09/12/24 1136               Bernis Ernst, PA-C 09/12/24 1138    Ruthe Cornet, DO 09/12/24 1327  "
